# Patient Record
Sex: Male | Born: 1955 | Race: Black or African American | Hispanic: No | Marital: Single
Health system: Southern US, Community
[De-identification: ages and names within clinical notes are randomized; demographics above are authoritative.]

## PROBLEM LIST (undated history)

## (undated) DIAGNOSIS — C3490 Malignant neoplasm of unspecified part of unspecified bronchus or lung: Secondary | ICD-10-CM

## (undated) DIAGNOSIS — I214 Non-ST elevation (NSTEMI) myocardial infarction: Secondary | ICD-10-CM

## (undated) DIAGNOSIS — Z9911 Dependence on respirator [ventilator] status: Secondary | ICD-10-CM

## (undated) DIAGNOSIS — J449 Chronic obstructive pulmonary disease, unspecified: Secondary | ICD-10-CM

## (undated) DIAGNOSIS — I469 Cardiac arrest, cause unspecified: Secondary | ICD-10-CM

---

## 2018-10-27 ENCOUNTER — Inpatient Hospital Stay (HOSPITAL_COMMUNITY)
Admission: EM | Admit: 2018-10-27 | Discharge: 2018-11-02 | DRG: 871 | Disposition: E | Payer: Medicare Other | Source: Skilled Nursing Facility | Attending: Pulmonary Disease | Admitting: Pulmonary Disease

## 2018-10-27 ENCOUNTER — Encounter (HOSPITAL_COMMUNITY): Payer: Self-pay | Admitting: Emergency Medicine

## 2018-10-27 ENCOUNTER — Emergency Department (HOSPITAL_COMMUNITY): Payer: Medicare Other

## 2018-10-27 ENCOUNTER — Other Ambulatory Visit: Payer: Self-pay

## 2018-10-27 DIAGNOSIS — C342 Malignant neoplasm of middle lobe, bronchus or lung: Secondary | ICD-10-CM | POA: Diagnosis present

## 2018-10-27 DIAGNOSIS — D649 Anemia, unspecified: Secondary | ICD-10-CM | POA: Insufficient documentation

## 2018-10-27 DIAGNOSIS — Z7951 Long term (current) use of inhaled steroids: Secondary | ICD-10-CM

## 2018-10-27 DIAGNOSIS — I252 Old myocardial infarction: Secondary | ICD-10-CM | POA: Diagnosis not present

## 2018-10-27 DIAGNOSIS — K922 Gastrointestinal hemorrhage, unspecified: Secondary | ICD-10-CM | POA: Diagnosis present

## 2018-10-27 DIAGNOSIS — C3412 Malignant neoplasm of upper lobe, left bronchus or lung: Secondary | ICD-10-CM | POA: Diagnosis present

## 2018-10-27 DIAGNOSIS — Z20828 Contact with and (suspected) exposure to other viral communicable diseases: Secondary | ICD-10-CM | POA: Diagnosis present

## 2018-10-27 DIAGNOSIS — M21372 Foot drop, left foot: Secondary | ICD-10-CM | POA: Diagnosis present

## 2018-10-27 DIAGNOSIS — I959 Hypotension, unspecified: Secondary | ICD-10-CM

## 2018-10-27 DIAGNOSIS — J869 Pyothorax without fistula: Secondary | ICD-10-CM | POA: Diagnosis present

## 2018-10-27 DIAGNOSIS — D500 Iron deficiency anemia secondary to blood loss (chronic): Secondary | ICD-10-CM | POA: Diagnosis present

## 2018-10-27 DIAGNOSIS — Z515 Encounter for palliative care: Secondary | ICD-10-CM | POA: Diagnosis not present

## 2018-10-27 DIAGNOSIS — J438 Other emphysema: Secondary | ICD-10-CM | POA: Diagnosis present

## 2018-10-27 DIAGNOSIS — I2510 Atherosclerotic heart disease of native coronary artery without angina pectoris: Secondary | ICD-10-CM | POA: Diagnosis present

## 2018-10-27 DIAGNOSIS — I509 Heart failure, unspecified: Secondary | ICD-10-CM | POA: Diagnosis present

## 2018-10-27 DIAGNOSIS — Y95 Nosocomial condition: Secondary | ICD-10-CM | POA: Diagnosis present

## 2018-10-27 DIAGNOSIS — Z930 Tracheostomy status: Secondary | ICD-10-CM

## 2018-10-27 DIAGNOSIS — Z923 Personal history of irradiation: Secondary | ICD-10-CM

## 2018-10-27 DIAGNOSIS — Z8674 Personal history of sudden cardiac arrest: Secondary | ICD-10-CM

## 2018-10-27 DIAGNOSIS — Z9911 Dependence on respirator [ventilator] status: Secondary | ICD-10-CM | POA: Diagnosis not present

## 2018-10-27 DIAGNOSIS — J9611 Chronic respiratory failure with hypoxia: Secondary | ICD-10-CM | POA: Diagnosis not present

## 2018-10-27 DIAGNOSIS — J181 Lobar pneumonia, unspecified organism: Secondary | ICD-10-CM | POA: Diagnosis present

## 2018-10-27 DIAGNOSIS — N179 Acute kidney failure, unspecified: Secondary | ICD-10-CM | POA: Diagnosis present

## 2018-10-27 DIAGNOSIS — Z66 Do not resuscitate: Secondary | ICD-10-CM | POA: Diagnosis not present

## 2018-10-27 DIAGNOSIS — R6521 Severe sepsis with septic shock: Secondary | ICD-10-CM | POA: Diagnosis present

## 2018-10-27 DIAGNOSIS — M21371 Foot drop, right foot: Secondary | ICD-10-CM | POA: Diagnosis present

## 2018-10-27 DIAGNOSIS — Z794 Long term (current) use of insulin: Secondary | ICD-10-CM

## 2018-10-27 DIAGNOSIS — J9621 Acute and chronic respiratory failure with hypoxia: Secondary | ICD-10-CM | POA: Diagnosis present

## 2018-10-27 DIAGNOSIS — J910 Malignant pleural effusion: Secondary | ICD-10-CM | POA: Diagnosis present

## 2018-10-27 DIAGNOSIS — Z7189 Other specified counseling: Secondary | ICD-10-CM | POA: Diagnosis not present

## 2018-10-27 DIAGNOSIS — Z9221 Personal history of antineoplastic chemotherapy: Secondary | ICD-10-CM

## 2018-10-27 DIAGNOSIS — Z79891 Long term (current) use of opiate analgesic: Secondary | ICD-10-CM

## 2018-10-27 DIAGNOSIS — Z931 Gastrostomy status: Secondary | ICD-10-CM

## 2018-10-27 DIAGNOSIS — Z91040 Latex allergy status: Secondary | ICD-10-CM

## 2018-10-27 DIAGNOSIS — Z7982 Long term (current) use of aspirin: Secondary | ICD-10-CM

## 2018-10-27 DIAGNOSIS — A419 Sepsis, unspecified organism: Secondary | ICD-10-CM | POA: Diagnosis present

## 2018-10-27 DIAGNOSIS — I9589 Other hypotension: Secondary | ICD-10-CM | POA: Diagnosis not present

## 2018-10-27 DIAGNOSIS — J432 Centrilobular emphysema: Secondary | ICD-10-CM | POA: Diagnosis present

## 2018-10-27 DIAGNOSIS — Z7902 Long term (current) use of antithrombotics/antiplatelets: Secondary | ICD-10-CM

## 2018-10-27 DIAGNOSIS — Z79899 Other long term (current) drug therapy: Secondary | ICD-10-CM

## 2018-10-27 HISTORY — DX: Cardiac arrest, cause unspecified: I46.9

## 2018-10-27 HISTORY — DX: Malignant neoplasm of unspecified part of unspecified bronchus or lung: C34.90

## 2018-10-27 HISTORY — DX: Chronic obstructive pulmonary disease, unspecified: J44.9

## 2018-10-27 HISTORY — DX: Dependence on respirator (ventilator) status: Z99.11

## 2018-10-27 HISTORY — DX: Non-ST elevation (NSTEMI) myocardial infarction: I21.4

## 2018-10-27 LAB — CBC WITH DIFFERENTIAL/PLATELET
Abs Immature Granulocytes: 0.15 10*3/uL — ABNORMAL HIGH (ref 0.00–0.07)
Basophils Absolute: 0.1 10*3/uL (ref 0.0–0.1)
Basophils Relative: 0 %
Eosinophils Absolute: 0.2 10*3/uL (ref 0.0–0.5)
Eosinophils Relative: 1 %
HCT: 21.6 % — ABNORMAL LOW (ref 39.0–52.0)
Hemoglobin: 6.5 g/dL — CL (ref 13.0–17.0)
Immature Granulocytes: 1 %
Lymphocytes Relative: 12 %
Lymphs Abs: 2.1 10*3/uL (ref 0.7–4.0)
MCH: 31.6 pg (ref 26.0–34.0)
MCHC: 30.1 g/dL (ref 30.0–36.0)
MCV: 104.9 fL — ABNORMAL HIGH (ref 80.0–100.0)
Monocytes Absolute: 1.4 10*3/uL — ABNORMAL HIGH (ref 0.1–1.0)
Monocytes Relative: 8 %
Neutro Abs: 14.1 10*3/uL — ABNORMAL HIGH (ref 1.7–7.7)
Neutrophils Relative %: 78 %
Platelets: 235 10*3/uL (ref 150–400)
RBC: 2.06 MIL/uL — ABNORMAL LOW (ref 4.22–5.81)
RDW: 17.1 % — ABNORMAL HIGH (ref 11.5–15.5)
WBC: 18 10*3/uL — ABNORMAL HIGH (ref 4.0–10.5)
nRBC: 0.1 % (ref 0.0–0.2)

## 2018-10-27 LAB — MRSA PCR SCREENING: MRSA by PCR: POSITIVE — AB

## 2018-10-27 LAB — HEMOGLOBIN AND HEMATOCRIT, BLOOD
HCT: 28 % — ABNORMAL LOW (ref 39.0–52.0)
Hemoglobin: 8.6 g/dL — ABNORMAL LOW (ref 13.0–17.0)

## 2018-10-27 LAB — POCT I-STAT 7, (LYTES, BLD GAS, ICA,H+H)
Acid-Base Excess: 14 mmol/L — ABNORMAL HIGH (ref 0.0–2.0)
Bicarbonate: 41.4 mmol/L — ABNORMAL HIGH (ref 20.0–28.0)
Calcium, Ion: 1.17 mmol/L (ref 1.15–1.40)
HCT: 19 % — ABNORMAL LOW (ref 39.0–52.0)
Hemoglobin: 6.5 g/dL — CL (ref 13.0–17.0)
O2 Saturation: 100 %
Patient temperature: 98.9
Potassium: 5 mmol/L (ref 3.5–5.1)
Sodium: 138 mmol/L (ref 135–145)
TCO2: 44 mmol/L — ABNORMAL HIGH (ref 22–32)
pCO2 arterial: 76.6 mmHg (ref 32.0–48.0)
pH, Arterial: 7.341 — ABNORMAL LOW (ref 7.350–7.450)
pO2, Arterial: 459 mmHg — ABNORMAL HIGH (ref 83.0–108.0)

## 2018-10-27 LAB — COMPREHENSIVE METABOLIC PANEL
ALT: 116 U/L — ABNORMAL HIGH (ref 0–44)
AST: 108 U/L — ABNORMAL HIGH (ref 15–41)
Albumin: 1.4 g/dL — ABNORMAL LOW (ref 3.5–5.0)
Alkaline Phosphatase: 186 U/L — ABNORMAL HIGH (ref 38–126)
Anion gap: 7 (ref 5–15)
BUN: 42 mg/dL — ABNORMAL HIGH (ref 8–23)
CO2: 35 mmol/L — ABNORMAL HIGH (ref 22–32)
Calcium: 8 mg/dL — ABNORMAL LOW (ref 8.9–10.3)
Chloride: 96 mmol/L — ABNORMAL LOW (ref 98–111)
Creatinine, Ser: 0.94 mg/dL (ref 0.61–1.24)
GFR calc Af Amer: >60 mL/min (ref >60)
GFR calc non Af Amer: >60 mL/min (ref >60)
Glucose, Bld: 158 mg/dL — ABNORMAL HIGH (ref 70–99)
Potassium: 5 mmol/L (ref 3.5–5.1)
Sodium: 138 mmol/L (ref 135–145)
Total Bilirubin: 0.7 mg/dL (ref 0.3–1.2)
Total Protein: 7.3 g/dL (ref 6.5–8.1)

## 2018-10-27 LAB — CREATININE, SERUM
Creatinine, Ser: 0.79 mg/dL (ref 0.61–1.24)
GFR calc Af Amer: >60 mL/min (ref >60)
GFR calc non Af Amer: >60 mL/min (ref >60)

## 2018-10-27 LAB — URINALYSIS, ROUTINE W REFLEX MICROSCOPIC
Bilirubin Urine: NEGATIVE
Glucose, UA: NEGATIVE mg/dL
Ketones, ur: NEGATIVE mg/dL
Leukocytes,Ua: NEGATIVE
Nitrite: NEGATIVE
Protein, ur: 100 mg/dL — AB
RBC / HPF: >50 RBC/hpf — ABNORMAL HIGH (ref 0–5)
Specific Gravity, Urine: 1.018 (ref 1.005–1.030)
pH: 5 (ref 5.0–8.0)

## 2018-10-27 LAB — GLUCOSE, CAPILLARY
Glucose-Capillary: 78 mg/dL (ref 70–99)
Glucose-Capillary: 95 mg/dL (ref 70–99)

## 2018-10-27 LAB — SARS CORONAVIRUS 2 (TAT 6-24 HRS): SARS Coronavirus 2: NEGATIVE

## 2018-10-27 LAB — ABO/RH: ABO/RH(D): O POS

## 2018-10-27 LAB — LACTIC ACID, PLASMA: Lactic Acid, Venous: 0.6 mmol/L (ref 0.5–1.9)

## 2018-10-27 LAB — POC OCCULT BLOOD, ED: Fecal Occult Bld: POSITIVE — AB

## 2018-10-27 LAB — PREPARE RBC (CROSSMATCH)

## 2018-10-27 MED ORDER — IOHEXOL 300 MG/ML  SOLN
100.0000 mL | Freq: Once | INTRAMUSCULAR | Status: AC | PRN
  Administered 2018-10-27: 100 mL via INTRAVENOUS

## 2018-10-27 MED ORDER — SODIUM CHLORIDE 0.9 % IV SOLN
500.0000 mg | INTRAVENOUS | Status: DC
  Administered 2018-10-27: 23:00:00 500 mg via INTRAVENOUS
  Filled 2018-10-27 (×2): qty 500

## 2018-10-27 MED ORDER — SODIUM CHLORIDE 0.9 % IV BOLUS
1000.0000 mL | Freq: Once | INTRAVENOUS | Status: AC
  Administered 2018-10-27: 16:00:00 1000 mL via INTRAVENOUS

## 2018-10-27 MED ORDER — ORAL CARE MOUTH RINSE
15.0000 mL | OROMUCOSAL | Status: DC
  Administered 2018-10-27 – 2018-10-28 (×8): 15 mL via OROMUCOSAL

## 2018-10-27 MED ORDER — VANCOMYCIN HCL 500 MG IV SOLR
500.0000 mg | Freq: Once | INTRAVENOUS | Status: AC
  Administered 2018-10-27: 500 mg via INTRAVENOUS
  Filled 2018-10-27 (×2): qty 500

## 2018-10-27 MED ORDER — CHLORHEXIDINE GLUCONATE 0.12% ORAL RINSE (MEDLINE KIT)
15.0000 mL | Freq: Two times a day (BID) | OROMUCOSAL | Status: DC
  Administered 2018-10-27 – 2018-10-28 (×2): 15 mL via OROMUCOSAL

## 2018-10-27 MED ORDER — VANCOMYCIN HCL IN DEXTROSE 1-5 GM/200ML-% IV SOLN
1000.0000 mg | Freq: Once | INTRAVENOUS | Status: AC
  Administered 2018-10-27: 1000 mg via INTRAVENOUS
  Filled 2018-10-27: qty 200

## 2018-10-27 MED ORDER — PIPERACILLIN-TAZOBACTAM 3.375 G IVPB
3.3750 g | Freq: Three times a day (TID) | INTRAVENOUS | Status: DC
  Administered 2018-10-28: 05:00:00 3.375 g via INTRAVENOUS
  Filled 2018-10-27 (×4): qty 50

## 2018-10-27 MED ORDER — ENOXAPARIN SODIUM 40 MG/0.4ML ~~LOC~~ SOLN
40.0000 mg | SUBCUTANEOUS | Status: DC
  Administered 2018-10-27: 40 mg via SUBCUTANEOUS
  Filled 2018-10-27: qty 0.4

## 2018-10-27 MED ORDER — SODIUM CHLORIDE 0.9 % IV SOLN
10.0000 mL/h | Freq: Once | INTRAVENOUS | Status: AC
  Administered 2018-10-27: 10 mL/h via INTRAVENOUS

## 2018-10-27 MED ORDER — DEXTROSE-NACL 5-0.45 % IV SOLN
INTRAVENOUS | Status: DC
  Administered 2018-10-27: 23:00:00 via INTRAVENOUS

## 2018-10-27 MED ORDER — PIPERACILLIN-TAZOBACTAM 3.375 G IVPB 30 MIN: 3.3750 g | Freq: Three times a day (TID) | INTRAVENOUS | Status: DC

## 2018-10-27 MED ORDER — SODIUM CHLORIDE 0.9 % IV BOLUS
1000.0000 mL | Freq: Once | INTRAVENOUS | Status: AC
  Administered 2018-10-27: 13:00:00 1000 mL via INTRAVENOUS

## 2018-10-27 MED ORDER — VANCOMYCIN HCL 10 G IV SOLR
1250.0000 mg | INTRAVENOUS | Status: DC
  Filled 2018-10-27: qty 1250

## 2018-10-27 MED ORDER — ALBUTEROL SULFATE (2.5 MG/3ML) 0.083% IN NEBU
3.0000 mL | INHALATION_SOLUTION | Freq: Four times a day (QID) | RESPIRATORY_TRACT | Status: DC
  Administered 2018-10-27 – 2018-10-28 (×4): 3 mL via RESPIRATORY_TRACT
  Filled 2018-10-27 (×2): qty 3
  Filled 2018-10-27: qty 6

## 2018-10-27 MED ORDER — PIPERACILLIN-TAZOBACTAM 3.375 G IVPB 30 MIN
3.3750 g | Freq: Once | INTRAVENOUS | Status: DC
  Filled 2018-10-27: qty 50

## 2018-10-27 MED ORDER — CHLORHEXIDINE GLUCONATE CLOTH 2 % EX PADS
6.0000 | MEDICATED_PAD | Freq: Every day | CUTANEOUS | Status: DC
  Administered 2018-10-27: 6 via TOPICAL

## 2018-10-27 MED ORDER — PIPERACILLIN-TAZOBACTAM 3.375 G IVPB 30 MIN
3.3750 g | Freq: Once | INTRAVENOUS | Status: AC
  Administered 2018-10-27: 3.375 g via INTRAVENOUS
  Filled 2018-10-27: qty 50

## 2018-10-27 MED ORDER — ALBUMIN HUMAN 25 % IV SOLN
50.0000 g | Freq: Once | INTRAVENOUS | Status: DC
  Filled 2018-10-27 (×2): qty 50

## 2018-10-27 NOTE — ED Notes (Signed)
ED TO INPATIENT HANDOFF REPORT  ED Nurse Abbott and Phone #: Lunette Stands Dave Abbott/Age/Gender Dave Abbott 63 y.o. male Room/Bed: RESUSC/RESUSC  Code Status   Code Status: Not on file  Home/SNF/Other Skilled nursing facility Patient oriented to: self, place, time and situation Is this baseline? Yes   Triage Complete: Triage complete  Chief Complaint bagging  Triage Note Per EMS- pt arrives from [facility], full code, and vent dependent. Pt is here for eval of hypotension of unknown cause. BP 80s, given 1 L of fluid.    Allergies Allergies  Allergen Reactions  . Latex     unknown  . Dave Abbott Isothiocyanate]     unknown    Level of Care/Admitting Diagnosis ED Disposition    ED Disposition Condition [facility] Area: [place] [100100]  Level of Care: ICU [6]  Covid Evaluation: Person Under Investigation (PUI)  Diagnosis: GIB (gastrointestinal bleeding) [474259]  Admitting Physician: Audria Nine [[id number]]  Attending Physician: Audria Nine [phone]  Estimated length of stay: 3 - 4 days  Certification:: I certify this patient will need inpatient services for at least 2 midnights  PT Class (Do Not Modify): Inpatient [101]  PT Acc Code (Do Not Modify): Private [1]       B Medical/Surgery History Past Medical History:  Diagnosis Date  . Cardiac arrest ([place])   . COPD (chronic obstructive pulmonary disease) ([place])   . Lung cancer ([place] )   . MI, acute, non ST segment elevation ([place])   . PEA (Pulseless electrical activity) ([place])   . Ventilator dependence ([place])        A IV Location/Drains/Wounds Patient Lines/Drains/Airways Status   Active Line/Drains/Airways    Abbott:   Placement date:   Placement time:   Site:   Days:   Implanted Port 10/13/2018 Right Chest   10/05/2018    1139    Chest   less than 1   Peripheral IV 10/26/2018 Left;Upper Arm   10/25/2018    1340    Arm   less than 1   Tracheostomy [place] 8 mm  Distal;Cuffed   -    -    8 mm             Intake/Output Last 24 hours  Intake/Output Summary (Last 24 hours) at 10/26/2018 1730 Last data filed at 10/22/2018 1716 Gross per 24 hour  Intake 1630 ml  Output -  Net 1630 ml    Labs/Imaging Results for orders placed or performed during the hospital encounter of 10/23/2018 (from the past 48 hour(s))  CBC with Differential/Platelet     Status: Abnormal   Collection Time: 10/19/2018 11:41 AM  Result Value Ref Range   WBC 18.0 (H) 4.0 - 10.5 K/uL   RBC 2.06 (L) 4.22 - 5.81 MIL/uL   Hemoglobin 6.5 (LL) 13.0 - 17.0 g/dL    Comment: REPEATED TO VERIFY THIS CRITICAL RESULT HAS VERIFIED AND BEEN CALLED TO A.LLOYD,RN BY BONNIE DAVIS ON 08 26 2020 AT 1213, AND HAS BEEN READ BACK.     HCT 21.6 (L) 39.0 - 52.0 %   MCV 104.9 (H) 80.0 - 100.0 fL   MCH 31.6 26.0 - 34.0 pg   MCHC 30.1 30.0 - 36.0 g/dL   RDW 17.1 (H) 11.5 - 15.5 %   Platelets 235 150 - 400 K/uL   nRBC 0.1 0.0 - 0.2 %   Neutrophils Relative % 78 %   Neutro Abs 14.1 (H) 1.7 -  7.7 K/uL   Lymphocytes Relative 12 %   Lymphs Abs 2.1 0.7 - 4.0 K/uL   Monocytes Relative 8 %   Monocytes Absolute 1.4 (H) 0.1 - 1.0 K/uL   Eosinophils Relative 1 %   Eosinophils Absolute 0.2 0.0 - 0.5 K/uL   Basophils Relative 0 %   Basophils Absolute 0.1 0.0 - 0.1 K/uL   Immature Granulocytes 1 %   Abs Immature Granulocytes 0.15 (H) 0.00 - 0.07 K/uL    Comment: Performed at [facility] [address] [place], [place] [address]  Comprehensive metabolic panel     Status: Abnormal   Collection Time: 10/12/2018 11:41 AM  Result Value Ref Range   Sodium 138 135 - 145 mmol/L   Potassium 5.0 3.5 - 5.1 mmol/L   Chloride 96 (L) 98 - 111 mmol/L   CO2 35 (H) 22 - 32 mmol/L   Glucose, Bld 158 (H) 70 - 99 mg/dL   BUN 42 (H) 8 - 23 mg/dL   Creatinine, Ser 0.94 0.61 - 1.24 mg/dL   Calcium 8.0 (L) 8.9 - 10.3 mg/dL   Total Protein 7.3 6.5 - 8.1 g/dL   Albumin 1.4 (L) 3.5 - 5.0 g/dL   AST 108 (H) 15 - 41  U/L   ALT 116 (H) 0 - 44 U/L   Alkaline Phosphatase 186 (H) 38 - 126 U/L   Total Bilirubin 0.7 0.3 - 1.2 mg/dL   GFR calc non Af Amer >60 >60 mL/min   GFR calc Af Amer >60 >60 mL/min   Anion gap 7 5 - 15    Comment: Performed at [facility], [place] [address], [place], [place] [address]  Urinalysis, Routine w reflex microscopic     Status: Abnormal   Collection Time: 10/26/2018 11:41 AM  Result Value Ref Range   Color, Urine AMBER (A) YELLOW    Comment: BIOCHEMICALS MAY BE AFFECTED BY COLOR   APPearance CLOUDY (A) CLEAR   Specific Gravity, Urine 1.018 1.005 - 1.030   pH 5.0 5.0 - 8.0   Glucose, UA NEGATIVE NEGATIVE mg/dL   Hgb urine dipstick LARGE (A) NEGATIVE   Bilirubin Urine NEGATIVE NEGATIVE   Ketones, ur NEGATIVE NEGATIVE mg/dL   Protein, ur 100 (A) NEGATIVE mg/dL   Nitrite NEGATIVE NEGATIVE   Leukocytes,Ua NEGATIVE NEGATIVE   RBC / HPF >50 (H) 0 - 5 RBC/hpf   WBC, UA 21-50 0 - 5 WBC/hpf   Bacteria, UA MANY (A) NONE SEEN   Squamous Epithelial / LPF 0-5 0 - 5   Hyaline Casts, UA PRESENT    Granular Casts, UA PRESENT     Comment: Performed at [facility], [address], [place], [place] [address]  Lactic acid, plasma     Status: None   Collection Time: 10/09/2018 11:53 AM  Result Value Ref Range   Lactic Acid, Venous 0.6 0.5 - 1.9 mmol/L    Comment: Performed at [facility] [address] [place], [place] [address]  Prepare RBC     Status: None   Collection Time: 10/20/2018 12:19 PM  Result Value Ref Range   Order Confirmation      ORDER PROCESSED BY BLOOD BANK Performed at [facility], [place] [address] [place], [place] [address]   Type and screen Ordered by PROVIDER DEFAULT     Status: None (Preliminary result)   Collection Time: 10/07/2018 12:19 PM  Result Value Ref Range   ABO/RH(D) O POS  Antibody Screen NEG    Sample Expiration 10/30/2018,2359    Unit Number [id number]    Blood Component Type RED CELLS,LR    Unit division 00    Status  of Unit ALLOCATED    Transfusion Status OK TO TRANSFUSE    Crossmatch Result Compatible    Unit Number [id number]    Blood Component Type RED CELLS,LR    Unit division 00    Status of Unit ISSUED    Transfusion Status OK TO TRANSFUSE    Crossmatch Result      Compatible Performed at [facility], [address] [place], [place] [address]   ABO/Rh     Status: None   Collection Time: 10/06/2018 12:19 PM  Result Value Ref Range   ABO/RH(D)      O POS Performed at [facility] [address] [place], [place] [address]   I-STAT 7, (LYTES, BLD GAS, ICA, H+H)     Status: Abnormal   Collection Time: 10/31/2018 12:41 PM  Result Value Ref Range   pH, Arterial 7.341 (L) 7.350 - 7.450   pCO2 arterial 76.6 (HH) 32.0 - 48.0 mmHg   pO2, Arterial 459.0 (H) 83.0 - 108.0 mmHg   Bicarbonate 41.4 (H) 20.0 - 28.0 mmol/L   TCO2 44 (H) 22 - 32 mmol/L   O2 Saturation 100.0 %   Acid-Base Excess 14.0 (H) 0.0 - 2.0 mmol/L   Sodium 138 135 - 145 mmol/L   Potassium 5.0 3.5 - 5.1 mmol/L   Calcium, Ion 1.17 1.15 - 1.40 mmol/L   HCT 19.0 (L) 39.0 - 52.0 %   Hemoglobin 6.5 (LL) 13.0 - 17.0 g/dL   Patient temperature 98.9 F    Collection site RADIAL, ALLEN'S TEST ACCEPTABLE    Drawn by RT    Sample type ARTERIAL    Comment NOTIFIED PHYSICIAN   POC occult blood, ED     Status: Abnormal   Collection Time: 10/09/2018 12:57 PM  Result Value Ref Range   Fecal Occult Bld POSITIVE (A) NEGATIVE   Ct Chest W Contrast  Result Date: 10/09/2018 CLINICAL DATA:  63 year old male with history of unresolved pneumonia. EXAM: CT CHEST, ABDOMEN, AND PELVIS WITH CONTRAST TECHNIQUE: Multidetector CT imaging of the chest, abdomen and pelvis was performed following the standard protocol during bolus administration of intravenous contrast. CONTRAST:  172mL OMNIPAQUE IOHEXOL 300 MG/ML  SOLN COMPARISON:  No priors. FINDINGS: CT CHEST FINDINGS Cardiovascular: Heart size is normal. There is no significant pericardial  fluid, thickening or pericardial calcification. There is aortic atherosclerosis, as well as atherosclerosis of the great vessels of the mediastinum and the coronary arteries, including calcified atherosclerotic plaque in the left anterior descending coronary artery. Right internal jugular single-lumen porta cath with tip terminating in the mid superior vena cava. Mediastinum/Nodes: Tracheostomy tube in position with tip in the mid trachea. Borderline enlarged subcarinal lymph node measuring 1.1 cm in short axis. No other pathologically enlarged mediastinal or hilar lymph nodes. Esophagus is unremarkable in appearance. No axillary lymphadenopathy. Lungs/Pleura: Patchy areas of airspace consolidation are noted in the lungs bilaterally, most confluent in the right middle lobe where there is also extensive volume loss. Several other nodular appearing areas are noted, largest of which includes a 1.7 x 0.9 cm area in the right lower lobe (axial image 97 of series 5). Large complex loculated left pleural fluid collection in the mid to upper left hemithorax with peripheral rim enhancement, most compatible with a large empyema measuring 13.8 x  10.0 x 4.7 cm (axial image 33 of series 4 and coronal image 63 of series 6). Other smaller complex loculated rim enhancing fluid collections are noted bilaterally, largest of which is in the subpulmonic space in the lower right hemithorax (axial image 58 of series 4 and coronal image 49 of series 6) measuring 11.3 x 6.6 x 3.9 cm. Near complete atelectasis of much of the left lung with some aerated portions in the basal segments of the left lower lobe where there is extensive atelectasis and patchy airspace consolidation. Diffuse bronchial wall thickening with severe centrilobular and paraseptal emphysema. Musculoskeletal: There are no aggressive appearing lytic or blastic lesions noted in the visualized portions of the skeleton. CT ABDOMEN PELVIS FINDINGS Hepatobiliary: No suspicious  cystic or solid hepatic lesions. No intra or extrahepatic biliary ductal dilatation. Gallbladder is nearly decompressed, but otherwise unremarkable in appearance. Pancreas: No pancreatic mass. No pancreatic ductal dilatation. No pancreatic or peripancreatic fluid collections or inflammatory changes. Spleen: Unremarkable. Adrenals/Urinary Tract: Bilateral adrenal glands are normal in appearance. Low-attenuation lesions in both kidneys, compatible with simple cysts, largest of which measure up to 3.6 x 2.7 cm in the upper pole of left kidney. In addition, in the interpolar region of the left kidney there is a 8.7 x 7.5 cm low-attenuation lesion with a thin internal septation, compatible with a large Bosniak class 2 cyst. No suspicious renal lesions. Subcentimeter low-attenuation lesions in the right kidney, too small to definitively characterize, but statistically likely to represent tiny cysts. No hydroureteronephrosis. Foley balloon catheter in the urinary bladder. Gas in the nondependent portion of the urinary bladder, presumably iatrogenic. Urinary bladder is otherwise unremarkable in appearance. Stomach/Bowel: Percutaneous gastrostomy tube in the distal body of the stomach. No pathologic dilatation of small bowel or colon. Normal appendix. Vascular/Lymphatic: Aortic atherosclerosis, without evidence of aneurysm or dissection in the abdominal or pelvic vasculature. No lymphadenopathy noted in the abdomen or pelvis. Reproductive: Prostate gland and seminal vesicles are unremarkable in appearance. Other: No significant volume of ascites.  No pneumoperitoneum. Musculoskeletal: There are no aggressive appearing lytic or blastic lesions noted in the visualized portions of the skeleton. IMPRESSION: 1. Multilobar bilateral pneumonia with bilateral empyemas, as detailed above. 2. No acute findings noted in the abdomen or pelvis. 3. Diffuse bronchial wall thickening with severe centrilobular and paraseptal emphysema;  imaging findings suggestive of underlying COPD. 4. Aortic atherosclerosis, in addition to left anterior descending coronary artery disease. Please note that although the presence of coronary artery calcium documents the presence of coronary artery disease, the severity of this disease and any potential stenosis cannot be assessed on this non-gated CT examination. Assessment for potential risk factor modification, dietary therapy or pharmacologic therapy may be warranted, if clinically indicated. 5. Additional incidental findings, as above. Electronically Signed   By: Vinnie Langton M.D.   On: 10/09/2018 14:55   Ct Abdomen Pelvis W Contrast  Result Date: 10/02/2018 CLINICAL DATA:  63 year old male with history of unresolved pneumonia. EXAM: CT CHEST, ABDOMEN, AND PELVIS WITH CONTRAST TECHNIQUE: Multidetector CT imaging of the chest, abdomen and pelvis was performed following the standard protocol during bolus administration of intravenous contrast. CONTRAST:  176mL OMNIPAQUE IOHEXOL 300 MG/ML  SOLN COMPARISON:  No priors. FINDINGS: CT CHEST FINDINGS Cardiovascular: Heart size is normal. There is no significant pericardial fluid, thickening or pericardial calcification. There is aortic atherosclerosis, as well as atherosclerosis of the great vessels of the mediastinum and the coronary arteries, including calcified atherosclerotic plaque in the left anterior descending coronary  artery. Right internal jugular single-lumen porta cath with tip terminating in the mid superior vena cava. Mediastinum/Nodes: Tracheostomy tube in position with tip in the mid trachea. Borderline enlarged subcarinal lymph node measuring 1.1 cm in short axis. No other pathologically enlarged mediastinal or hilar lymph nodes. Esophagus is unremarkable in appearance. No axillary lymphadenopathy. Lungs/Pleura: Patchy areas of airspace consolidation are noted in the lungs bilaterally, most confluent in the right middle lobe where there is also  extensive volume loss. Several other nodular appearing areas are noted, largest of which includes a 1.7 x 0.9 cm area in the right lower lobe (axial image 97 of series 5). Large complex loculated left pleural fluid collection in the mid to upper left hemithorax with peripheral rim enhancement, most compatible with a large empyema measuring 13.8 x 10.0 x 4.7 cm (axial image 33 of series 4 and coronal image 63 of series 6). Other smaller complex loculated rim enhancing fluid collections are noted bilaterally, largest of which is in the subpulmonic space in the lower right hemithorax (axial image 58 of series 4 and coronal image 49 of series 6) measuring 11.3 x 6.6 x 3.9 cm. Near complete atelectasis of much of the left lung with some aerated portions in the basal segments of the left lower lobe where there is extensive atelectasis and patchy airspace consolidation. Diffuse bronchial wall thickening with severe centrilobular and paraseptal emphysema. Musculoskeletal: There are no aggressive appearing lytic or blastic lesions noted in the visualized portions of the skeleton. CT ABDOMEN PELVIS FINDINGS Hepatobiliary: No suspicious cystic or solid hepatic lesions. No intra or extrahepatic biliary ductal dilatation. Gallbladder is nearly decompressed, but otherwise unremarkable in appearance. Pancreas: No pancreatic mass. No pancreatic ductal dilatation. No pancreatic or peripancreatic fluid collections or inflammatory changes. Spleen: Unremarkable. Adrenals/Urinary Tract: Bilateral adrenal glands are normal in appearance. Low-attenuation lesions in both kidneys, compatible with simple cysts, largest of which measure up to 3.6 x 2.7 cm in the upper pole of left kidney. In addition, in the interpolar region of the left kidney there is a 8.7 x 7.5 cm low-attenuation lesion with a thin internal septation, compatible with a large Bosniak class 2 cyst. No suspicious renal lesions. Subcentimeter low-attenuation lesions in the  right kidney, too small to definitively characterize, but statistically likely to represent tiny cysts. No hydroureteronephrosis. Foley balloon catheter in the urinary bladder. Gas in the nondependent portion of the urinary bladder, presumably iatrogenic. Urinary bladder is otherwise unremarkable in appearance. Stomach/Bowel: Percutaneous gastrostomy tube in the distal body of the stomach. No pathologic dilatation of small bowel or colon. Normal appendix. Vascular/Lymphatic: Aortic atherosclerosis, without evidence of aneurysm or dissection in the abdominal or pelvic vasculature. No lymphadenopathy noted in the abdomen or pelvis. Reproductive: Prostate gland and seminal vesicles are unremarkable in appearance. Other: No significant volume of ascites.  No pneumoperitoneum. Musculoskeletal: There are no aggressive appearing lytic or blastic lesions noted in the visualized portions of the skeleton. IMPRESSION: 1. Multilobar bilateral pneumonia with bilateral empyemas, as detailed above. 2. No acute findings noted in the abdomen or pelvis. 3. Diffuse bronchial wall thickening with severe centrilobular and paraseptal emphysema; imaging findings suggestive of underlying COPD. 4. Aortic atherosclerosis, in addition to left anterior descending coronary artery disease. Please note that although the presence of coronary artery calcium documents the presence of coronary artery disease, the severity of this disease and any potential stenosis cannot be assessed on this non-gated CT examination. Assessment for potential risk factor modification, dietary therapy or pharmacologic therapy may be  warranted, if clinically indicated. 5. Additional incidental findings, as above. Electronically Signed   By: Vinnie Langton M.D.   On: 10/02/2018 14:55   Dg Chest Port 1 View  Result Date: 10/12/2018 CLINICAL DATA:  Hypotension EXAM: PORTABLE CHEST 1 VIEW COMPARISON:  None. FINDINGS: Tracheostomy is present with tip 6.9 cm above the  carina. Port-A-Cath tip is in the superior vena cava. No pneumothorax. There is extensive postoperative change on the left with consolidation throughout most of the left upper and mid lung regions. There is aeration in the left base region with fibrotic appearing changes in the left base. On the right, there is fibrosis in the mid and lower lung zones. There is postoperative change in the right upper lobe. No consolidation on the right. Heart size is grossly normal. Pulmonary vascular on the right appears normal. Pulmonary vascularity on the left is obscured. No adenopathy is seen to the right of midline. Opacification precludes assessment for potential adenopathy on the left. No bone lesions are evident. IMPRESSION: Extensive postoperative changes. Consolidation throughout much of the left mid and lower lung zones. Question infiltrate versus mass. Both entities may be present concurrently. There is fibrosis in the left base. There is also fibrosis in the right mid and lower lung zones. No consolidation on the right. Cardiac silhouette grossly normal. Tube and catheter positions as described without demonstrable pneumothorax. Correlation with CT, ideally with intravenous contrast, may well be advised to further assess areas which are opacified on this current examination. Electronically Signed   By: Lowella Grip III M.D.   On: 10/12/2018 12:00    Pending Labs Unresulted Labs (From admission, onward)    Start     Ordered   10/22/2018 2000  Hemoglobin and hematocrit, blood  Now then every 6 hours,   R     10/23/2018 1544   10/24/2018 1548  Culture, respiratory (non-expectorated)  Once,   STAT     11/01/2018 1548   10/12/2018 1255  Urine culture  ONCE - STAT,   STAT     10/06/2018 1254   10/03/2018 1235  Occult blood card to lab, stool  Once,   STAT     10/14/2018 1235   10/05/2018 1211  Blood gas, arterial  Once,   R     10/02/2018 1210   10/24/2018 1149  SARS CORONAVIRUS 2 (TAT 6-12 HRS) Nasal Swab Aptima Multi Swab   (Asymptomatic/Tier 2)  Once,   STAT    Question Answer Comment  Is this test for diagnosis or screening Screening   Symptomatic for COVID-19 as defined by CDC No   Hospitalized for COVID-19 No   Admitted to ICU for COVID-19 No   Previously tested for COVID-19 Yes   Resident in a congregate (group) care setting Yes   Employed in healthcare setting No      11/01/2018 1148   10/08/2018 1141  Culture, blood (Routine X 2) w Reflex to ID Panel  BLOOD CULTURE X 2,   STAT     10/24/2018 1141   Signed and Held  HIV antibody (Routine Testing)  Once,   R     Signed and Held   Signed and Held  Magnesium  Once,   R     Signed and Held   Signed and Held  Comprehensive metabolic panel  Once,   R     Signed and Held   Signed and Held  CBC  Once,   R     Signed and  Held   Signed and Held  Protime-INR  Once,   R     Signed and Held   Signed and Occupational hygienist morning,   R     Signed and Held   Signed and Held  CBC  Tomorrow morning,   R     Signed and Held   Signed and Held  Magnesium  Tomorrow morning,   R     Signed and Held   Signed and Held  Phosphorus  Tomorrow morning,   R     Signed and Held          Vitals/Pain Today's Vitals   10/30/2018 1500 10/03/2018 1518 10/14/2018 1630 10/24/2018 1713  BP: (!) 83/63  96/65 101/73  Pulse:    97  Resp: (!) 22  15 (!) 23  Temp:    98.2 F (36.8 C)  TempSrc:    Oral  SpO2:  100%  97%  Height:        Isolation Precautions No active isolations  Medications Medications  0.9 %  sodium chloride infusion (10 mL/hr Intravenous New Bag/Given 10/15/2018 1642)  sodium chloride 0.9 % bolus 1,000 mL (0 mLs Intravenous Stopped 10/08/2018 1530)  vancomycin (VANCOCIN) IVPB 1000 mg/200 mL premix (0 mg Intravenous Stopped 10/24/2018 1516)  piperacillin-tazobactam (ZOSYN) IVPB 3.375 g (0 g Intravenous Stopped 10/25/2018 1420)  iohexol (OMNIPAQUE) 300 MG/ML solution 100 mL (100 mLs Intravenous Contrast Given 10/03/2018 1411)  sodium chloride 0.9 % bolus  1,000 mL (0 mLs Intravenous Stopped 10/06/2018 1640)    Mobility non-ambulatory     Focused Assessments Cardiac Assessment Handoff:    No results found for: CKTOTAL, CKMB, CKMBINDEX, TROPONINI No results found for: DDIMER Does the Patient currently have chest pain? No      R Recommendations: See Admitting Provider Note  Report given to:   Additional Notes: N/A

## 2018-10-27 NOTE — ED Provider Notes (Signed)
[facility] EMERGENCY DEPARTMENT Provider Note   CSN: [id number] Arrival date & time: 10/31/2018  1129     History   Chief Complaint Chief Complaint  Patient presents with  . Hypotension    HPI Dave Abbott is a 63 y.o. male.     HPI Patient arrives from [facility] by EMS.  Patient is on a ventilator.  He is unable to contribute to history.  Noted to be hypotensive by staff.  Given 1 L of normal saline prior to arrival. Past Medical History:  Diagnosis Date  . Cardiac arrest ([place])   . COPD (chronic obstructive pulmonary disease) ([place])   . Lung cancer ([place])   . MI, acute, non ST segment elevation ([place])   . PEA (Pulseless electrical activity) ([place])   . Ventilator dependence [facility])     Patient Active Problem List   Diagnosis Date Noted  . GIB (gastrointestinal bleeding) 10/24/2018  . Ventilator dependent ([place])   . Septic shock ([place])   . Symptomatic anemia         Home Medications    Prior to Admission medications   Medication Sig Start Date End Date Taking? Authorizing Provider  acetaminophen (TYLENOL) 325 MG tablet Place 650 mg into feeding tube every 6 (six) hours as needed for mild pain or fever.   Yes [provider]  ALPRAZolam Duanne Moron) 0.5 MG tablet Place 0.5 mg into feeding tube at bedtime as needed for anxiety.   Yes [provider]  aspirin 81 MG chewable tablet Chew 81 mg by mouth daily.   Yes [provider]  atorvastatin (LIPITOR) 80 MG tablet Place 80 mg into feeding tube daily.   Yes [provider]  bisacodyl (DULCOLAX) 5 MG EC tablet Take 5 mg by mouth daily as needed for mild constipation.   Yes [provider]  budesonide (PULMICORT) 0.5 MG/2ML nebulizer solution Take 0.5 mg by nebulization 2 (two) times daily.   Yes [provider]  carvedilol (COREG) 6.25 MG tablet Place 6.25 mg into feeding tube 2 (two) times daily with a meal.   Yes [provider]   chlorhexidine (PERIDEX) 0.12 % solution Use as directed 3 mLs in the mouth or throat daily.   Yes [provider]  clopidogrel (PLAVIX) 75 MG tablet Take 75 mg by mouth daily.   Yes [provider]  docusate (COLACE) 50 MG/5ML liquid Place 50 mg into feeding tube 2 (two) times daily.   Yes [provider]  famotidine (PEPCID) 20 MG tablet Place 20 mg into feeding tube 2 (two) times daily.   Yes [provider]  fentaNYL (DURAGESIC) 12 MCG/HR Place 1 patch onto the skin every 3 (three) days.   Yes [provider]  ferrous sulfate 300 (60 Fe) MG/5ML syrup Place 300 mg into feeding tube daily.   Yes [provider]  Heparin Sodium, Porcine, PF 5000 UNIT/ML SOLN Inject 5,000 Units as directed every 12 (twelve) hours.   Yes [provider]  insulin lispro (HUMALOG) 100 UNIT/ML injection Inject 3-15 Units into the skin See admin instructions. Sliding Scale Insulin: Blood sugar is <60.00 or > 500.00 Notify MD.  Blood Sugar is 151-200 give 3 units, 201-300 give 5 units, 301-400 give 10 units, 401-500 give 25 units. NOTE: Greater than 500 administer 20 units, repeat BS after 2hrs, treat as indicated.   Yes [provider]  ipratropium-albuterol (DUONEB) 0.5-2.5 (3) MG/3ML SOLN Take 3 mLs by nebulization every 6 (six) hours.  Yes [provider]  Multiple Vitamin (MULTIVITAMIN WITH MINERALS) TABS tablet Place 1 tablet into feeding tube daily.   Yes [provider]  oxyCODONE (OXY IR/ROXICODONE) 5 MG immediate release tablet Place 5 mg into feeding tube daily as needed for severe pain.   Yes [provider]  polyethylene glycol (MIRALAX / GLYCOLAX) 17 g packet Place 17 g into feeding tube daily.   Yes [provider]  senna (SENOKOT) 8.6 MG tablet Take 1 tablet by mouth daily.   Yes [provider]  Sodium Phosphates ([employer] ENEMA READY-TO-USE) 7-19 GM/118ML ENEM Place 1 enema rectally daily as  needed (For Constipation).   Yes [provider]  venlafaxine (EFFEXOR) 25 MG tablet Place 25 mg into feeding tube 2 (two) times daily.   Yes [provider]    Family History No family history on file.  Social History Social History   Tobacco Use  . Smoking status: Not on file  Substance Use Topics  . Alcohol use: Not on file  . Drug use: Not on file     Allergies   Latex and Mustard [allyl isothiocyanate]   Review of Systems Review of Systems  Unable to perform ROS: Patient nonverbal     Physical Exam Updated Vital Signs BP (!) 89/65   Pulse 90   Temp 98.5 F (36.9 C) (Oral)   Resp 16   Ht 5\' 7"  (1.702 m)   Wt 55.4 kg   SpO2 92%   BMI 19.13 kg/m   Physical Exam Vitals signs and nursing note reviewed.  Constitutional:      Appearance: He is well-developed.     Comments: Chronically ill-appearing  HENT:     Head: Normocephalic and atraumatic.  Eyes:     Pupils: Pupils are equal, round, and reactive to light.  Neck:     Musculoskeletal: Normal range of motion and neck supple. No neck rigidity or muscular tenderness.     Vascular: No carotid bruit.     Comments: Trach in place Cardiovascular:     Rate and Rhythm: Regular rhythm. Tachycardia present.     Heart sounds: No murmur. No friction rub. No gallop.   Pulmonary:     Effort: Pulmonary effort is normal.     Breath sounds: Wheezing present.  Abdominal:     General: Bowel sounds are normal. There is distension.     Palpations: Abdomen is soft.     Tenderness: There is no abdominal tenderness. There is no guarding or rebound.     Comments: PEG in left upper quadrant.  Abdomen is diffusely distended.  Musculoskeletal: Normal range of motion.        General: No swelling, tenderness, deformity or signs of injury.     Right lower leg: No edema.     Left lower leg: No edema.  Lymphadenopathy:     Cervical: No cervical adenopathy.  Skin:    General: Skin is warm and dry.      Capillary Refill: Capillary refill takes less than 2 seconds.     Findings: No erythema or rash.  Neurological:     Mental Status: He is alert.     Comments: Patient is alert.  Appears to follow some very simple commands.      ED Treatments / Results  Labs (all labs ordered are listed, but only abnormal results are displayed) Labs Reviewed  MRSA PCR SCREENING - Abnormal; Notable for the following components:      Result Value  MRSA by PCR POSITIVE (*)    All other components within normal limits  CBC WITH DIFFERENTIAL/PLATELET - Abnormal; Notable for the following components:   WBC 18.0 (*)    RBC 2.06 (*)    Hemoglobin 6.5 (*)    HCT 21.6 (*)    MCV 104.9 (*)    RDW 17.1 (*)    Neutro Abs 14.1 (*)    Monocytes Absolute 1.4 (*)    Abs Immature Granulocytes 0.15 (*)    All other components within normal limits  COMPREHENSIVE METABOLIC PANEL - Abnormal; Notable for the following components:   Chloride 96 (*)    CO2 35 (*)    Glucose, Bld 158 (*)    BUN 42 (*)    Calcium 8.0 (*)    Albumin 1.4 (*)    AST 108 (*)    ALT 116 (*)    Alkaline Phosphatase 186 (*)    All other components within normal limits  URINALYSIS, ROUTINE W REFLEX MICROSCOPIC - Abnormal; Notable for the following components:   Color, Urine AMBER (*)    APPearance CLOUDY (*)    Hgb urine dipstick LARGE (*)    Protein, ur 100 (*)    RBC / HPF >50 (*)    Bacteria, UA MANY (*)    All other components within normal limits  HEMOGLOBIN AND HEMATOCRIT, BLOOD - Abnormal; Notable for the following components:   Hemoglobin 8.6 (*)    HCT 28.0 (*)    All other components within normal limits  HEMOGLOBIN AND HEMATOCRIT, BLOOD - Abnormal; Notable for the following components:   Hemoglobin 7.9 (*)    HCT 25.5 (*)    All other components within normal limits  CBC WITH DIFFERENTIAL/PLATELET - Abnormal; Notable for the following components:   WBC 17.5 (*)    RBC 2.66 (*)    Hemoglobin 7.9 (*)    HCT 25.8 (*)     RDW 19.8 (*)    Neutro Abs 16.1 (*)    Lymphs Abs 0.6 (*)    Abs Immature Granulocytes 0.13 (*)    All other components within normal limits  STREP PNEUMONIAE URINARY ANTIGEN - Abnormal; Notable for the following components:   Strep Pneumo Urinary Antigen POSITIVE (*)    All other components within normal limits  GLUCOSE, CAPILLARY - Abnormal; Notable for the following components:   Glucose-Capillary 125 (*)    All other components within normal limits  POCT I-STAT 7, (LYTES, BLD GAS, ICA,H+H) - Abnormal; Notable for the following components:   pH, Arterial 7.341 (*)    pCO2 arterial 76.6 (*)    pO2, Arterial 459.0 (*)    Bicarbonate 41.4 (*)    TCO2 44 (*)    Acid-Base Excess 14.0 (*)    HCT 19.0 (*)    Hemoglobin 6.5 (*)    All other components within normal limits  POC OCCULT BLOOD, ED - Abnormal; Notable for the following components:   Fecal Occult Bld POSITIVE (*)    All other components within normal limits  POCT I-STAT 7, (LYTES, BLD GAS, ICA,H+H) - Abnormal; Notable for the following components:   pCO2 arterial 54.9 (*)    pO2, Arterial 67.0 (*)    Bicarbonate 30.8 (*)    Acid-Base Excess 5.0 (*)    HCT 26.0 (*)    Hemoglobin 8.8 (*)    All other components within normal limits  CULTURE, BLOOD (ROUTINE X 2)  CULTURE, BLOOD (ROUTINE X 2)  SARS CORONAVIRUS 2 (  TAT 6-12 HRS)  URINE CULTURE  CULTURE, RESPIRATORY  EXPECTORATED SPUTUM ASSESSMENT W REFEX TO RESP CULTURE  EXPECTORATED SPUTUM ASSESSMENT W REFEX TO RESP CULTURE  LACTIC ACID, PLASMA  GLUCOSE, CAPILLARY  CREATININE, SERUM  INFLUENZA PANEL BY PCR (TYPE A & B)  LEGIONELLA PNEUMOPHILA SEROGP 1 UR AG  GLUCOSE, CAPILLARY  GLUCOSE, CAPILLARY  PREPARE RBC (CROSSMATCH)  TYPE AND SCREEN  ABO/RH    EKG EKG Interpretation  Date/Time:  Wednesday October 27 2018 11:39:10 EDT Ventricular Rate:  99 PR Interval:    QRS Duration: 88 QT Interval:  325 QTC Calculation: 417 R Axis:   85 Text Interpretation:   Sinus rhythm Borderline right axis deviation Abnormal T, consider ischemia, lateral leads Confirmed by Julianne Rice [phone]) on 10/03/2018 11:52:53 AM   Radiology No results found.  Procedures Procedures (including critical care time)  Medications Ordered in ED Medications  0.9 %  sodium chloride infusion (0 mL/hr Intravenous Stopped 10/21/2018 1802)  sodium chloride 0.9 % bolus 1,000 mL (0 mLs Intravenous Stopped 10/26/2018 1530)  vancomycin (VANCOCIN) IVPB 1000 mg/200 mL premix (0 mg Intravenous Stopped 10/09/2018 1516)  piperacillin-tazobactam (ZOSYN) IVPB 3.375 g (0 g Intravenous Stopped 10/17/2018 1420)  iohexol (OMNIPAQUE) 300 MG/ML solution 100 mL (100 mLs Intravenous Contrast Given 10/08/2018 1411)  sodium chloride 0.9 % bolus 1,000 mL (0 mLs Intravenous Stopped 10/20/2018 1640)  vancomycin (VANCOCIN) 500 mg in sodium chloride 0.9 % 100 mL IVPB (500 mg Intravenous New Bag/Given 10/03/2018 2230)  albumin human 5 % solution 12.5 g ( Intravenous Paused November 09, 2018 0119)  norepinephrine (LEVOPHED) 4-5 MG/250ML-% infusion SOLN (  Duplicate 04/26/80 5003)   CRITICAL CARE Performed by: Julianne Rice Total critical care time: 35 minutes Critical care time was exclusive of separately billable procedures and treating other patients. Critical care was necessary to treat or prevent imminent or life-threatening deterioration. Critical care was time spent personally by me on the following activities: development of treatment plan with patient and/or surrogate as well as nursing, discussions with consultants, evaluation of patient's response to treatment, examination of patient, obtaining history from patient or surrogate, ordering and performing treatments and interventions, ordering and review of laboratory studies, ordering and review of radiographic studies, pulse oximetry and re-evaluation of patient's condition.  Initial Impression / Assessment and Plan / ED Course  I have reviewed the triage vital signs and  the nursing notes.  Pertinent labs & imaging results that were available during my care of the patient were reviewed by me and considered in my medical decision making (see chart for details).        Patient is evidence of sepsis and symptomatic anemia.  Stool is heme positive but brown. Initiated broad-spectrum antibiotics and IV fluids as well as typed and crossed for blood transfusion.  Persistent hypotension despite IV fluids and started on Levophed.  Discussed with critical care who will see patient emergency department and admit.  Final Clinical Impressions(s) / ED Diagnoses   Final diagnoses:  Gastrointestinal hemorrhage, unspecified gastrointestinal hemorrhage type  Sepsis, due to unspecified organism, unspecified whether acute organ dysfunction present ([place])  Hypotension, unspecified hypotension type    ED Discharge Orders    None       Julianne Rice, MD 11/01/18 2212

## 2018-10-27 NOTE — Progress Notes (Signed)
[facility] Progress Note Patient Name: Dave Abbott DOB: August 26, 1955 MRN: [id number]   Date of Service  10/06/2018  HPI/Events of Note  Pt admitted with pneumonia and sepsis. He is immunocompromised.  eICU Interventions  New patient evaluation completed  And orders entered.        Frederik Pear 10/30/2018, 9:49 PM

## 2018-10-27 NOTE — Progress Notes (Signed)
Patient transported to CT and back without complications. RN at bedside.  

## 2018-10-27 NOTE — ED Notes (Signed)
Pt does not consent to blood transfusion. MD informed.

## 2018-10-27 NOTE — H&P (Signed)
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 Admitted w/ new septic shock on 8/26  History of present illness   62 yo male with PMH significant for stage IIIB poorly diff T4N2MC Lung cancer. S/p XRT, (involving the left upper, left middle lobe and also had associated malignant effusion. had started on chemo prior to July 2020. Was initially admitted to outside hospital in July 2020 for STEMI. Treated w/ lytics at the time. Had subsequent cardiac arrest requiring ACLS (time to ROSC not certain), Was intubated and placed on mechanical ventilation. He was treated for pneumonia, subsequent septic shock and eventually required trach and PEG as was not able to come off vent. He was deemed no longer a candidate for chemo. Was resistant to DNR. Admitted to [facility] vent/SNF. Since arrival in end of July has remained vent dependent. Does get OOB eventually. Admitted w/ finding by staff w/ new hypotension. PCCM asked to admit.  In ER:  Wbc ct: 18.0, hgb 6.5, lactic acid 0.6. CT chest/abd pelvis w/ multifocal PNA. PCCM asked to admit. Working dx of sepsis/septic shock + w/ likely source PNA and symptomatic anemia. Heme +  Past Medical History   COPD, chronic respiratory failure, CAD, CHF, stage IIIB lung cancer, malignant left effusion. Pigtail cath had been removed. Vent dependent and not able to come off vent. PEG dependent   Significant Hospital Events   8/26 admitted w/ working dx of  sepsis/septic shock + w/ likely source PNA and symptomatic anemia. Heme +    Consults:  8/26: GI  Procedures:  Left port (PTA)>>> Trach (PTA)>>  Significant Diagnostic Tests:  8/26: CT Abdomen/Pelvis:. No acute findings noted in the abdomen or pelvis. 8/26:  CT Chest:  Multilobar bilateral pneumonia with bilateral empyemas, as detailed above. Underlying COPD  Micro Data:  8/26: Blood Cx  >> 8/26: UA >> 8/26: Respiratory culture >>  Antimicrobials:  8/26: Vancomycin 8/26: Zosyn  Interim history/subjective:  No distress.   Objective   Blood pressure (!) 83/63, pulse (!) 101, temperature 98.2 F (36.8 C), temperature source Axillary, resp. rate (!) 22, height 5\' 7"  (1.702 m), SpO2 100 %.    Vent Mode: PCV FiO2 (%):  [50 %-100 %] 50 % Set Rate:  [14 bmp-16 bmp] 16 bmp Vt Set:  [530 mL] 530 mL PEEP:  [5 cmH20] 5 cmH20 Pressure Support:  [10 cmH20] 10 cmH20 Plateau Pressure:  [23 cmH20] 23 cmH20  No intake or output data in the 24 hours ending 10/08/2018 1525 There were no vitals filed for this visit.  Examination: General: frail 63 year old black male currently on full vent support.  HENT: trach unremarkable. Temporal wasting MMM Lungs: wheezing t/o right. Left decreased t/o Cardiovascular: tachy rrr  Abdomen: soft not tender PEG unremarkable  Extremities: warm and dry bilateral foot drop  Neuro: awake and alert no focal def  GU: voiding   Resolved Hospital Problem list     Assessment & Plan:   Severe sepsis/septic shock; possibly c/b GIB W/ source likely VAP but could also be post-obstructive process from known left sided lung cancer.  -abd films non-diagnostic. Had heme-+ stool AND hgb 6.5 so this is probably also contributing.  Plan Cont IVFs Agree w/ tranfusion  Pan culture  Holding any antihypertensives or diuretics  IV vanc/zosyn Admit to ICU Would refrain from sampling left pleural space as this is chronic and suspect that the lung is trapped.   Chronic Hypoxic respiratory failure in setting of advanced emphysema and further c/b extensive stage IIIB poorly diff T4N2MC of the lung involving the LUL, LML and chronic left effusion Plan Cont full vent support Scheduled BDs PAD protocol VAP bundle  Infection rx as  above   Anemia w/ hemoccult positive stool (not sure if this is symptomatic or if BP being driven by sepsis) Plan GI consulted PPI Trend cbc  H/o CAD and HTN Plan Holding anticoagulation, antihypertensives and diuretics    Best practice:  Diet: tubefeeds Pain/Anxiety/Delirium protocol (if indicated): pad protocol VAP protocol (if indicated): 8/26 DVT prophylaxis: scd GI prophylaxis: ppi Glucose control: ssi Mobility: bedrest Code Status: full code  Family Communication: pending  Disposition: admit to ICU for volume resuscitation, abx etc   Labs   CBC: Recent Labs  Lab 10/05/2018 1141 10/23/2018 1241  WBC 18.0*  --   NEUTROABS 14.1*  --   HGB 6.5* 6.5*  HCT 21.6* 19.0*  MCV 104.9*  --   PLT 235  --     Basic Metabolic Panel: Recent Labs  Lab 10/17/2018 1141 10/05/2018 1241  NA 138 138  K 5.0 5.0  CL 96*  --   CO2 35*  --   GLUCOSE 158*  --   BUN 42*  --   CREATININE 0.94  --   CALCIUM 8.0*  --    GFR: CrCl cannot be calculated (Unknown ideal weight.). Recent Labs  Lab 10/25/2018 1141 10/08/2018 1153  WBC 18.0*  --   LATICACIDVEN  --  0.6    Liver Function Tests: Recent Labs  Lab 10/25/2018 1141  AST 108*  ALT 116*  ALKPHOS 186*  BILITOT 0.7  PROT 7.3  ALBUMIN 1.4*   No results for input(s): LIPASE, AMYLASE in the last 168 hours. No results for input(s): AMMONIA in the last 168 hours.  ABG    Component Value Date/Time   PHART 7.341 (L) 10/26/2018 1241   PCO2ART 76.6 (HH) 10/26/2018 1241   PO2ART 459.0 (H) 10/07/2018 1241   HCO3 41.4 (H) 10/06/2018 1241   TCO2 44 (H) 10/22/2018 1241   O2SAT 100.0 10/20/2018 1241     Coagulation Profile: No results for input(s): INR, PROTIME in the last 168 hours.  Cardiac Enzymes: No results for input(s): CKTOTAL, CKMB, CKMBINDEX, TROPONINI in the last 168 hours.  HbA1C: No results found for: HGBA1C  CBG: No results for input(s): GLUCAP in the last 168 hours.  Review of Systems:   Not able   Past  Medical History  He,  has a past medical history of Cardiac arrest ([place]), COPD (chronic obstructive pulmonary disease) ([place]), Lung cancer ([place]), MI, acute, non ST segment elevation ([place]), PEA (Pulseless electrical activity) ([place]), and Ventilator dependence ([place]).   Surgical History   PEG and trach    Social History    resides at SNF  Family History   His family history is not on file.   Allergies No Known Allergies   Home Medications  Prior to Admission medications   Not on File     Critical care time:  38 minutes.     Erick Colace ACNP-BC [place] Pager # [phone] OR # [phone] if no answer

## 2018-10-27 NOTE — Progress Notes (Signed)
Patient switched from SIMV/PRVC to pressure control because of peak pressures in the 40's. Patient tolerating this mode better and getting good volumes. RT will monitor.

## 2018-10-27 NOTE — ED Notes (Signed)
Pt has consented to blood.

## 2018-10-27 NOTE — ED Triage Notes (Addendum)
Per EMS- pt arrives from [facility], full code, and vent dependent. Pt is here for eval of hypotension of unknown cause. BP 80s, given 1 L of fluid.

## 2018-10-27 NOTE — Progress Notes (Signed)
Pharmacy Antibiotic Note  Marjorie Lussier is a 63 y.o. male admitted on 10/14/2018 with pneumonia.  Pharmacy has been consulted for Vancomycin dosing.  Vancomycin 1250 mg IV Q 24 hrs. Goal AUC 400-550. Expected AUC: 476 SCr used: 0.94  Plan: Vanc 1 gram given in the ED.  Will give an additional 500 mg IV x 1 tonight for a total of 1500 mg today and then start 1250 mg IV q24hr. Monitor renal function, clinical status, C&S, and vanc levels as needed.  Height: 5\' 7"  (170.2 cm) Weight: 118 lb 13.3 oz (53.9 kg) IBW/kg (Calculated) : 66.1  Temp (24hrs), Avg:98.1 F (36.7 C), Min:97.5 F (36.4 C), Max:98.9 F (37.2 C)  Recent Labs  Lab 10/06/2018 1141 10/17/2018 1153  WBC 18.0*  --   CREATININE 0.94  --   LATICACIDVEN  --  0.6    Estimated Creatinine Clearance: 62.1 mL/min (by C-G formula based on SCr of 0.94 mg/dL).    Allergies  Allergen Reactions  . Latex     unknown  . Madelaine Bhat Isothiocyanate]     unknown    Antimicrobials this admission: Vanc 8/26 >>  Zosyn 8/26 >>  Thank you for allowing pharmacy to be a part of this patient's care.  Alanda Slim, PharmD, [facility] Clinical Pharmacist Please see AMION for all Pharmacists' Contact Phone Numbers 10/21/2018, 10:04 PM

## 2018-10-28 DIAGNOSIS — Z7189 Other specified counseling: Secondary | ICD-10-CM

## 2018-10-28 DIAGNOSIS — Z515 Encounter for palliative care: Secondary | ICD-10-CM

## 2018-10-28 DIAGNOSIS — K922 Gastrointestinal hemorrhage, unspecified: Secondary | ICD-10-CM

## 2018-10-28 LAB — BPAM RBC
Blood Product Expiration Date: 202009022359
Blood Product Expiration Date: 202009022359
ISSUE DATE / TIME: 202008261410
ISSUE DATE / TIME: 202008261744
Unit Type and Rh: 5100
Unit Type and Rh: 5100

## 2018-10-28 LAB — STREP PNEUMONIAE URINARY ANTIGEN: Strep Pneumo Urinary Antigen: POSITIVE — AB

## 2018-10-28 LAB — POCT I-STAT 7, (LYTES, BLD GAS, ICA,H+H)
Acid-Base Excess: 5 mmol/L — ABNORMAL HIGH (ref 0.0–2.0)
Bicarbonate: 30.8 mmol/L — ABNORMAL HIGH (ref 20.0–28.0)
Calcium, Ion: 1.23 mmol/L (ref 1.15–1.40)
HCT: 26 % — ABNORMAL LOW (ref 39.0–52.0)
Hemoglobin: 8.8 g/dL — ABNORMAL LOW (ref 13.0–17.0)
O2 Saturation: 90 %
Patient temperature: 102
Potassium: 4.3 mmol/L (ref 3.5–5.1)
Sodium: 142 mmol/L (ref 135–145)
TCO2: 32 mmol/L (ref 22–32)
pCO2 arterial: 54.9 mmHg — ABNORMAL HIGH (ref 32.0–48.0)
pH, Arterial: 7.366 (ref 7.350–7.450)
pO2, Arterial: 67 mmHg — ABNORMAL LOW (ref 83.0–108.0)

## 2018-10-28 LAB — TYPE AND SCREEN
ABO/RH(D): O POS
Antibody Screen: NEGATIVE
Unit division: 0
Unit division: 0

## 2018-10-28 LAB — URINE CULTURE: Culture: NO GROWTH

## 2018-10-28 LAB — GLUCOSE, CAPILLARY
Glucose-Capillary: 91 mg/dL (ref 70–99)
Glucose-Capillary: 125 mg/dL — ABNORMAL HIGH (ref 70–99)

## 2018-10-28 LAB — HEMOGLOBIN AND HEMATOCRIT, BLOOD
HCT: 25.5 % — ABNORMAL LOW (ref 39.0–52.0)
Hemoglobin: 7.9 g/dL — ABNORMAL LOW (ref 13.0–17.0)

## 2018-10-28 LAB — INFLUENZA PANEL BY PCR (TYPE A & B)
Influenza A By PCR: NEGATIVE
Influenza B By PCR: NEGATIVE

## 2018-10-28 LAB — CBC WITH DIFFERENTIAL/PLATELET
Abs Immature Granulocytes: 0.13 10*3/uL — ABNORMAL HIGH (ref 0.00–0.07)
Basophils Absolute: 0 10*3/uL (ref 0.0–0.1)
Basophils Relative: 0 %
Eosinophils Absolute: 0.1 10*3/uL (ref 0.0–0.5)
Eosinophils Relative: 1 %
HCT: 25.8 % — ABNORMAL LOW (ref 39.0–52.0)
Hemoglobin: 7.9 g/dL — ABNORMAL LOW (ref 13.0–17.0)
Immature Granulocytes: 1 %
Lymphocytes Relative: 3 %
Lymphs Abs: 0.6 10*3/uL — ABNORMAL LOW (ref 0.7–4.0)
MCH: 29.7 pg (ref 26.0–34.0)
MCHC: 30.6 g/dL (ref 30.0–36.0)
MCV: 97 fL (ref 80.0–100.0)
Monocytes Absolute: 0.6 10*3/uL (ref 0.1–1.0)
Monocytes Relative: 3 %
Neutro Abs: 16.1 10*3/uL — ABNORMAL HIGH (ref 1.7–7.7)
Neutrophils Relative %: 92 %
Platelets: 187 10*3/uL (ref 150–400)
RBC: 2.66 MIL/uL — ABNORMAL LOW (ref 4.22–5.81)
RDW: 19.8 % — ABNORMAL HIGH (ref 11.5–15.5)
WBC Morphology: INCREASED
WBC: 17.5 10*3/uL — ABNORMAL HIGH (ref 4.0–10.5)
nRBC: 0 % (ref 0.0–0.2)

## 2018-10-28 MED ORDER — MORPHINE 100MG IN NS 100ML (1MG/ML) PREMIX INFUSION
1.0000 mg/h | INTRAVENOUS | Status: DC
  Administered 2018-10-28: 10:00:00 2 mg/h via INTRAVENOUS
  Filled 2018-10-28: qty 100

## 2018-10-28 MED ORDER — GLYCOPYRROLATE 0.2 MG/ML IJ SOLN: 0.2000 mg | INTRAMUSCULAR | Status: DC | PRN

## 2018-10-28 MED ORDER — NOREPINEPHRINE 4 MG/250ML-% IV SOLN
INTRAVENOUS | Status: AC
  Filled 2018-10-28: qty 250

## 2018-10-28 MED ORDER — GLYCOPYRROLATE 1 MG PO TABS: 1.0000 mg | ORAL_TABLET | ORAL | Status: DC | PRN

## 2018-10-28 MED ORDER — MORPHINE BOLUS VIA INFUSION
5.0000 mg | INTRAVENOUS | Status: DC | PRN
  Filled 2018-10-28: qty 5

## 2018-10-28 MED ORDER — POLYVINYL ALCOHOL 1.4 % OP SOLN
1.0000 [drp] | Freq: Four times a day (QID) | OPHTHALMIC | Status: DC | PRN
  Filled 2018-10-28: qty 15

## 2018-10-28 MED ORDER — MORPHINE 100MG IN NS 100ML (1MG/ML) PREMIX INFUSION: 0.0000 mg/h | INTRAVENOUS | Status: DC

## 2018-10-28 MED ORDER — ACETAMINOPHEN 160 MG/5ML PO SOLN
650.0000 mg | ORAL | Status: DC | PRN
  Administered 2018-10-28: 650 mg
  Filled 2018-10-28: qty 20.3

## 2018-10-28 MED ORDER — ACETAMINOPHEN 650 MG RE SUPP: 650.0000 mg | Freq: Four times a day (QID) | RECTAL | Status: DC | PRN

## 2018-10-28 MED ORDER — DIPHENHYDRAMINE HCL 50 MG/ML IJ SOLN: 25.0000 mg | INTRAMUSCULAR | Status: DC | PRN

## 2018-10-28 MED ORDER — SODIUM CHLORIDE 0.9 % IV BOLUS: 2000.0000 mL | Freq: Once | INTRAVENOUS | Status: DC

## 2018-10-28 MED ORDER — LIP MEDEX EX OINT
TOPICAL_OINTMENT | CUTANEOUS | Status: DC | PRN
  Filled 2018-10-28: qty 7

## 2018-10-28 MED ORDER — MORPHINE SULFATE (PF) 2 MG/ML IV SOLN: 2.0000 mg | INTRAVENOUS | Status: DC | PRN

## 2018-10-28 MED ORDER — ACETAMINOPHEN 325 MG PO TABS: 650.0000 mg | ORAL_TABLET | Freq: Four times a day (QID) | ORAL | Status: DC | PRN

## 2018-10-28 MED ORDER — NOREPINEPHRINE 4 MG/250ML-% IV SOLN
0.0000 ug/min | INTRAVENOUS | Status: DC
  Administered 2018-10-28: 4 ug/min via INTRAVENOUS

## 2018-10-28 MED ORDER — DEXTROSE 5 % IV SOLN: INTRAVENOUS | Status: DC

## 2018-10-28 MED ORDER — ALBUMIN HUMAN 5 % IV SOLN
12.5000 g | Freq: Once | INTRAVENOUS | Status: AC
  Administered 2018-10-28: 12.5 g via INTRAVENOUS
  Filled 2018-10-28: qty 250

## 2018-10-29 LAB — LEGIONELLA PNEUMOPHILA SEROGP 1 UR AG: L. pneumophila Serogp 1 Ur Ag: NEGATIVE

## 2018-11-01 LAB — CULTURE, BLOOD (ROUTINE X 2)
Culture: NO GROWTH
Culture: NO GROWTH
Special Requests: ADEQUATE

## 2018-11-01 LAB — CULTURE, RESPIRATORY W GRAM STAIN

## 2018-11-02 NOTE — Consult Note (Addendum)
[place] Nurse wound consult note Reason for Consult:Consult requested for penis and sacrum.  [place] team is performing consults remotely today; reviewed progress notes and nursing wound care flowsheets.  Wound type: Anterior penis is reported to have a full thickness wound, pink and dry of unknown etiology; this location is not consistent with a pressure injury.  3X1cm.   Measurement: Sacrum is reported to have a healing pressure injury; 4X2cm pink and dry and shallow.  This was present on admission. Dressing procedure/placement/frequency: Foam dressing to protect and promote healing to sacrum wound.   Xeroform gauze to promote moist healing to penis; location is difficult to have a dressing remain in place. Please re-consult if further assistance is needed.  Thank-you,  Julien Girt MSN, [place], [place], [place], [place]

## 2018-11-02 NOTE — Progress Notes (Signed)
Family arrived and visited.  Ready for withdrawal.  Withdrawal orders placed.  RN to notify family prior to extubation.  Rush Farmer, M.D. [facility] Pulmonary/Critical Care Medicine. Pager: [phone]. After hours pager: [phone]

## 2018-11-02 NOTE — Progress Notes (Signed)
Wasted 25 cc of fentanyl with Aldona Bar, RN

## 2018-11-02 NOTE — Progress Notes (Signed)
Nutrition Brief Note  Dietitian Consult received for Enteral Nutrition. Chart reviewed. Pt now transitioning to comfort care.  No further nutrition interventions warranted at this time.  Please re-consult as needed.   [employer] MS, RDN, LDN, CNSC [phone] Pager  [phone] Weekend/On-Call Pager

## 2018-11-02 NOTE — Progress Notes (Signed)
eLink Physician-Brief Progress Note Patient Name: Dave Abbott DOB: 19-Apr-1955 MRN: [id number]   Date of Service  2018-11-27  HPI/Events of Note  Hypotension despite cautious volume resuscitation.  eICU Interventions  Levophed infusion ordered.        Kerry Kass Valli Randol 2018-11-27, 3:40 AM

## 2018-11-02 NOTE — Progress Notes (Signed)
Spoke with Efraim Kaufmann who is the patient niece and POA.  After a long discussion, decision was made to transition to comfort care with full DNR and start morphine drip.  Niece is to come to see patient.  The patient is critically ill with multiple organ systems failure and requires high complexity decision making for assessment and support, frequent evaluation and titration of therapies, application of advanced monitoring technologies and extensive interpretation of multiple databases.   Critical Care Time devoted to patient care services described in this note is  60  Minutes. This time reflects time of care of this signee Dr Jennet Maduro. This critical care time does not reflect procedure time, or teaching time or supervisory time of PA/NP/Med student/Med Resident etc but could involve care discussion time.  Rush Farmer, M.D. [facility] Pulmonary/Critical Care Medicine. Pager: [phone]. After hours pager: [phone].

## 2018-11-02 NOTE — H&P (Signed)
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 Admitted w/ new septic shock on 8/26  History of present illness   63 yo male with PMH significant for stage IIIB poorly diff T4N2MC Lung cancer. S/p XRT, (involving the left upper, left middle lobe and also had associated malignant effusion. had started on chemo prior to July 2020. Was initially admitted to outside hospital in July 2020 for STEMI. Treated w/ lytics at the time. Had subsequent cardiac arrest requiring ACLS (time to ROSC not certain), Was intubated and placed on mechanical ventilation. He was treated for pneumonia, subsequent septic shock and eventually required trach and PEG as was not able to come off vent. He was deemed no longer a candidate for chemo. Was resistant to DNR. Admitted to [facility] vent/SNF. Since arrival in end of July has remained vent dependent. Does get OOB eventually. Admitted w/ finding by staff w/ new hypotension. PCCM asked to admit.  In ER:  Wbc ct: 18.0, hgb 6.5, lactic acid 0.6. CT chest/abd pelvis w/ multifocal PNA. PCCM asked to admit. Working dx of sepsis/septic shock + w/ likely source PNA and symptomatic anemia. Heme +   Past Medical History   COPD, chronic respiratory failure, CAD, CHF, stage IIIB lung cancer, malignant left effusion. Pigtail cath had been removed. Vent dependent and not able to come off vent. PEG dependent   Significant Hospital Events   8/26 admitted w/ working dx of  sepsis/septic shock + w/ likely source PNA and symptomatic anemia. Heme +    Consults:  8/26: GI  Procedures:  Left port (PTA)>>> Trach (PTA)>>  Significant Diagnostic Tests:  8/26: CT Abdomen/Pelvis:. No acute findings noted in the abdomen or pelvis.  8/26: CT Chest:  Multilobar bilateral pneumonia with bilateral empyemas, as detailed above. Underlying COPD  Micro Data:  8/26: Blood Cx  >> 8/26: UA >> 8/26: Respiratory culture >>  Antimicrobials:  8/26: Vancomycin 8/26: Zosyn  Interim history/subjective:  No events overnight, remains on full vent support  Objective   Blood pressure 105/69, pulse 87, temperature 98.5 F (36.9 C), temperature source Oral, resp. rate (!) 28, height 5\' 7"  (1.702 m), weight 55.4 kg, SpO2 99 %.    Vent Mode: PCV FiO2 (%):  [40 %-100 %] 50 % Set Rate:  [14 bmp-16 bmp] 16 bmp Vt Set:  [530 mL] 530 mL PEEP:  [5 cmH20] 5 cmH20 Pressure Support:  [10 cmH20] 10 cmH20 Plateau Pressure:  [23 cmH20-34 cmH20] 32 cmH20   Intake/Output Summary (Last 24 hours) at 2018/11/19 0845 Last data filed at 11/19/18 0600 Gross per 24 hour  Intake 4095.89 ml  Output 650 ml  Net 3445.89 ml   Filed Weights   10/13/2018 2133 Nov 19, 2018 0354  Weight: 53.9 kg 55.4 kg   Examination: General: frail 63 year old black male currently on full vent support.  HENT: trach unremarkable. Temporal wasting MMM Lungs: wheezing t/o right. Left decreased t/o Cardiovascular: tachy rrr  Abdomen: soft not tender PEG unremarkable  Extremities: warm and dry bilateral foot drop  Neuro: awake and alert no focal def  GU: voiding   I reviewed chest CT myself, masses noted and trach in a good position  Resolved Hospital Problem list  Assessment & Plan:   Severe sepsis/septic shock; possibly c/b GIB W/ source likely VAP but could also be post-obstructive process from known left sided lung cancer.  -abd films non-diagnostic. Had heme-+ stool AND hgb 6.5 so this is probably also contributing.  Plan 2L NS bolus Attempt to get off pressors today Hold further transfusion at this point Pan culture f/u Holding any antihypertensives or diuretics  IV vanc/zosyn No thora, no sampling Levophed for BP support  Chronic Hypoxic  respiratory failure in setting of advanced emphysema and further c/b extensive stage IIIB poorly diff T4N2MC of the lung involving the LUL, LML and chronic left effusion Plan Change to PRVC from PC ABG and CXR to PRN at this point Scheduled BDs PAD protocol VAP bundle  Infection rx as above   Anemia w/ hemoccult positive stool (not sure if this is symptomatic or if BP being driven by sepsis) Plan GI consult called this AM PPI Trend cbc  H/o CAD and HTN Plan Holding anticoagulation, antihypertensives and diuretics    Best practice:  Diet: tubefeeds Pain/Anxiety/Delirium protocol (if indicated): pad protocol VAP protocol (if indicated): 8/26 DVT prophylaxis: scd GI prophylaxis: ppi Glucose control: ssi Mobility: bedrest Code Status: full code  Family Communication: pending  Disposition: admit to ICU for volume resuscitation, abx etc   Labs   CBC: Recent Labs  Lab 10/07/2018 1141 10/31/2018 1241 10/09/2018 2000 23-Nov-2018 0358 11/23/2018 0437  WBC 18.0*  --   --   --   --   NEUTROABS 14.1*  --   --   --   --   HGB 6.5* 6.5* 8.6* 7.9* 8.8*  HCT 21.6* 19.0* 28.0* 25.5* 26.0*  MCV 104.9*  --   --   --   --   PLT 235  --   --   --   --     Basic Metabolic Panel: Recent Labs  Lab 10/12/2018 1141 10/03/2018 1241 10/26/2018 2138 November 23, 2018 0437  NA 138 138  --  142  K 5.0 5.0  --  4.3  CL 96*  --   --   --   CO2 35*  --   --   --   GLUCOSE 158*  --   --   --   BUN 42*  --   --   --   CREATININE 0.94  --  0.79  --   CALCIUM 8.0*  --   --   --    GFR: Estimated Creatinine Clearance: 75 mL/min (by C-G formula based on SCr of 0.79 mg/dL). Recent Labs  Lab 10/05/2018 1141 10/23/2018 1153  WBC 18.0*  --   LATICACIDVEN  --  0.6    Liver Function Tests: Recent Labs  Lab 10/20/2018 1141  AST 108*  ALT 116*  ALKPHOS 186*  BILITOT 0.7  PROT 7.3  ALBUMIN 1.4*   No results for input(s): LIPASE, AMYLASE in the last 168 hours. No results for input(s): AMMONIA in the last 168  hours.  ABG    Component Value Date/Time   PHART 7.366 11-23-18 0437   PCO2ART 54.9 (H) 11-23-2018 0437   PO2ART 67.0 (L) 11/23/18 0437   HCO3 30.8 (H) 23-Nov-2018 0437   TCO2 32 11/23/18 0437   O2SAT 90.0 November 23, 2018 0437     Coagulation Profile: No results for input(s): INR, PROTIME in the last 168 hours.  Cardiac Enzymes: No results for input(s): CKTOTAL, CKMB, CKMBINDEX, TROPONINI in the last 168 hours.  HbA1C: No results found for: HGBA1C  CBG: Recent Labs  Lab 10/16/2018 1949 10/15/2018 2331 11/23/18 0348 11/23/18 0728  GLUCAP 95 78 91 125*   The patient is critically ill with multiple organ systems failure and requires high complexity decision making for assessment and support, frequent evaluation and titration of therapies, application of advanced monitoring technologies and extensive interpretation of multiple databases.   Critical Care Time devoted to patient care services described in this note is  32  Minutes. This time reflects time of care of this signee Dr Jennet Maduro. This critical care time does not reflect procedure time, or teaching time or supervisory time of PA/NP/Med student/Med Resident etc but could involve care discussion time.  Rush Farmer, M.D. [facility] Pulmonary/Critical Care Medicine. Pager: [phone]. After hours pager: [phone].

## 2018-11-02 NOTE — Progress Notes (Signed)
eLink Physician-Brief Progress Note Patient Name: Dave Abbott DOB: Mar 11, 1955 MRN: [id number]   Date of Service  Nov 19, 2018  HPI/Events of Note  Fever  eICU Interventions  Tylenol prn order entered.        [name] November 19, 2018, 4:07 AM

## 2018-11-02 NOTE — Procedures (Signed)
Extubation Procedure Note  Patient Details:   Name: Dave Abbott DOB: 1956/02/28 MRN: [id number]   Airway Documentation:    Vent end date: November 03, 2018 Vent end time: 1810   Evaluation  O2 sats: Immediately decreased after terminating ventilator support. Complications: No apparent complications due to patient being comfort care. Patient did not tolerate procedure well. Bilateral Breath Sounds: Rhonchi   No   Withdrew patient from ventilator support with RN at the bedside. Patient is comfortable at this time & is not having any difficulty breathing.   Dave Abbott, Eddie North 11-03-2018, 6:10 PM

## 2018-11-02 NOTE — Progress Notes (Signed)
Called patient's niece (POA) to proceed forward with withdrawal. I explained that respiratory will disconnect the patient from the ventilator. She stated that is why she came today and that it is okay to proceed to withdraw and remove patient from the ventilator.

## 2018-11-02 DEATH — deceased

## 2018-12-02 NOTE — Death Summary Note (Signed)
DEATH SUMMARY   Patient Details  Name: Dave Abbott MRN: [id number] DOB: 1955-08-23  Admission/Discharge Information   Admit Date:  10/30/18  Date of Death: Date of Death: 2018-10-31  Time of Death: Time of Death: 05/14/1831  Length of Stay: 1  Referring Physician: Townsend Roger, MD   Reason(s) for Hospitalization  Septic shock  Diagnoses  Preliminary cause of death:   Septic shock Secondary Diagnoses (including complications and co-morbidities):  Active Problems:   GIB (gastrointestinal bleeding) Acute kidney injury Acute respiratory failure Hemorrhagic anemia  Brief Hospital Course (including significant findings, care, treatment, and services provided and events leading to death)  63 year old male w/ advanced COPD, 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 Admitted w/ new septic shock on 2022-10-30.  Patient was cared for in the ICU and failed to make any progress.    Spoke with Efraim Kaufmann who is the patient niece and POA.  After a long discussion, decision was made to transition to comfort care with full DNR and start morphine drip.  Niece is to come to see patient.  Family arrived and visited.  Ready for withdrawal.  Withdrawal orders placed.  Patient was extubated to expire shortly thereafter comfortable with the family bedside.  Pertinent Labs and Studies  Significant Diagnostic Studies Ct Chest W Contrast  Result Date: 10-30-2018 CLINICAL DATA:  63 year old male with history of unresolved pneumonia. EXAM: CT CHEST, ABDOMEN, AND PELVIS WITH CONTRAST TECHNIQUE: Multidetector CT imaging of the chest, abdomen and pelvis was performed following the standard protocol during bolus administration of intravenous contrast. CONTRAST:  137mL OMNIPAQUE IOHEXOL 300 MG/ML  SOLN COMPARISON:  No priors. FINDINGS: CT CHEST FINDINGS Cardiovascular: Heart size is normal. There is no significant pericardial fluid, thickening or pericardial  calcification. There is aortic atherosclerosis, as well as atherosclerosis of the great vessels of the mediastinum and the coronary arteries, including calcified atherosclerotic plaque in the left anterior descending coronary artery. Right internal jugular single-lumen porta cath with tip terminating in the mid superior vena cava. Mediastinum/Nodes: Tracheostomy tube in position with tip in the mid trachea. Borderline enlarged subcarinal lymph node measuring 1.1 cm in short axis. No other pathologically enlarged mediastinal or hilar lymph nodes. Esophagus is unremarkable in appearance. No axillary lymphadenopathy. Lungs/Pleura: Patchy areas of airspace consolidation are noted in the lungs bilaterally, most confluent in the right middle lobe where there is also extensive volume loss. Several other nodular appearing areas are noted, largest of which includes a 1.7 x 0.9 cm area in the right lower lobe (axial image 97 of series 5). Large complex loculated left pleural fluid collection in the mid to upper left hemithorax with peripheral rim enhancement, most compatible with a large empyema measuring 13.8 x 10.0 x 4.7 cm (axial image 33 of series 4 and coronal image 63 of series 6). Other smaller complex loculated rim enhancing fluid collections are noted bilaterally, largest of which is in the subpulmonic space in the lower right hemithorax (axial image 58 of series 4 and coronal image 49 of series 6) measuring 11.3 x 6.6 x 3.9 cm. Near complete atelectasis of much of the left lung with some aerated portions in the basal segments of the left lower lobe where there is extensive atelectasis and patchy airspace consolidation. Diffuse bronchial wall thickening with severe centrilobular and paraseptal emphysema. Musculoskeletal: There are no aggressive appearing lytic or blastic lesions noted in the visualized portions of the skeleton. CT ABDOMEN PELVIS FINDINGS Hepatobiliary: No  suspicious cystic or solid hepatic lesions.  No intra or extrahepatic biliary ductal dilatation. Gallbladder is nearly decompressed, but otherwise unremarkable in appearance. Pancreas: No pancreatic mass. No pancreatic ductal dilatation. No pancreatic or peripancreatic fluid collections or inflammatory changes. Spleen: Unremarkable. Adrenals/Urinary Tract: Bilateral adrenal glands are normal in appearance. Low-attenuation lesions in both kidneys, compatible with simple cysts, largest of which measure up to 3.6 x 2.7 cm in the upper pole of left kidney. In addition, in the interpolar region of the left kidney there is a 8.7 x 7.5 cm low-attenuation lesion with a thin internal septation, compatible with a large Bosniak class 2 cyst. No suspicious renal lesions. Subcentimeter low-attenuation lesions in the right kidney, too small to definitively characterize, but statistically likely to represent tiny cysts. No hydroureteronephrosis. Foley balloon catheter in the urinary bladder. Gas in the nondependent portion of the urinary bladder, presumably iatrogenic. Urinary bladder is otherwise unremarkable in appearance. Stomach/Bowel: Percutaneous gastrostomy tube in the distal body of the stomach. No pathologic dilatation of small bowel or colon. Normal appendix. Vascular/Lymphatic: Aortic atherosclerosis, without evidence of aneurysm or dissection in the abdominal or pelvic vasculature. No lymphadenopathy noted in the abdomen or pelvis. Reproductive: Prostate gland and seminal vesicles are unremarkable in appearance. Other: No significant volume of ascites.  No pneumoperitoneum. Musculoskeletal: There are no aggressive appearing lytic or blastic lesions noted in the visualized portions of the skeleton. IMPRESSION: 1. Multilobar bilateral pneumonia with bilateral empyemas, as detailed above. 2. No acute findings noted in the abdomen or pelvis. 3. Diffuse bronchial wall thickening with severe centrilobular and paraseptal emphysema; imaging findings suggestive of  underlying COPD. 4. Aortic atherosclerosis, in addition to left anterior descending coronary artery disease. Please note that although the presence of coronary artery calcium documents the presence of coronary artery disease, the severity of this disease and any potential stenosis cannot be assessed on this non-gated CT examination. Assessment for potential risk factor modification, dietary therapy or pharmacologic therapy may be warranted, if clinically indicated. 5. Additional incidental findings, as above. Electronically Signed   By: Vinnie Langton M.D.   On: 10/10/2018 14:55   Ct Abdomen Pelvis W Contrast  Result Date: 11/01/2018 CLINICAL DATA:  63 year old male with history of unresolved pneumonia. EXAM: CT CHEST, ABDOMEN, AND PELVIS WITH CONTRAST TECHNIQUE: Multidetector CT imaging of the chest, abdomen and pelvis was performed following the standard protocol during bolus administration of intravenous contrast. CONTRAST:  130mL OMNIPAQUE IOHEXOL 300 MG/ML  SOLN COMPARISON:  No priors. FINDINGS: CT CHEST FINDINGS Cardiovascular: Heart size is normal. There is no significant pericardial fluid, thickening or pericardial calcification. There is aortic atherosclerosis, as well as atherosclerosis of the great vessels of the mediastinum and the coronary arteries, including calcified atherosclerotic plaque in the left anterior descending coronary artery. Right internal jugular single-lumen porta cath with tip terminating in the mid superior vena cava. Mediastinum/Nodes: Tracheostomy tube in position with tip in the mid trachea. Borderline enlarged subcarinal lymph node measuring 1.1 cm in short axis. No other pathologically enlarged mediastinal or hilar lymph nodes. Esophagus is unremarkable in appearance. No axillary lymphadenopathy. Lungs/Pleura: Patchy areas of airspace consolidation are noted in the lungs bilaterally, most confluent in the right middle lobe where there is also extensive volume loss. Several  other nodular appearing areas are noted, largest of which includes a 1.7 x 0.9 cm area in the right lower lobe (axial image 97 of series 5). Large complex loculated left pleural fluid collection in the mid to upper left hemithorax with peripheral  rim enhancement, most compatible with a large empyema measuring 13.8 x 10.0 x 4.7 cm (axial image 33 of series 4 and coronal image 63 of series 6). Other smaller complex loculated rim enhancing fluid collections are noted bilaterally, largest of which is in the subpulmonic space in the lower right hemithorax (axial image 58 of series 4 and coronal image 49 of series 6) measuring 11.3 x 6.6 x 3.9 cm. Near complete atelectasis of much of the left lung with some aerated portions in the basal segments of the left lower lobe where there is extensive atelectasis and patchy airspace consolidation. Diffuse bronchial wall thickening with severe centrilobular and paraseptal emphysema. Musculoskeletal: There are no aggressive appearing lytic or blastic lesions noted in the visualized portions of the skeleton. CT ABDOMEN PELVIS FINDINGS Hepatobiliary: No suspicious cystic or solid hepatic lesions. No intra or extrahepatic biliary ductal dilatation. Gallbladder is nearly decompressed, but otherwise unremarkable in appearance. Pancreas: No pancreatic mass. No pancreatic ductal dilatation. No pancreatic or peripancreatic fluid collections or inflammatory changes. Spleen: Unremarkable. Adrenals/Urinary Tract: Bilateral adrenal glands are normal in appearance. Low-attenuation lesions in both kidneys, compatible with simple cysts, largest of which measure up to 3.6 x 2.7 cm in the upper pole of left kidney. In addition, in the interpolar region of the left kidney there is a 8.7 x 7.5 cm low-attenuation lesion with a thin internal septation, compatible with a large Bosniak class 2 cyst. No suspicious renal lesions. Subcentimeter low-attenuation lesions in the right kidney, too small to  definitively characterize, but statistically likely to represent tiny cysts. No hydroureteronephrosis. Foley balloon catheter in the urinary bladder. Gas in the nondependent portion of the urinary bladder, presumably iatrogenic. Urinary bladder is otherwise unremarkable in appearance. Stomach/Bowel: Percutaneous gastrostomy tube in the distal body of the stomach. No pathologic dilatation of small bowel or colon. Normal appendix. Vascular/Lymphatic: Aortic atherosclerosis, without evidence of aneurysm or dissection in the abdominal or pelvic vasculature. No lymphadenopathy noted in the abdomen or pelvis. Reproductive: Prostate gland and seminal vesicles are unremarkable in appearance. Other: No significant volume of ascites.  No pneumoperitoneum. Musculoskeletal: There are no aggressive appearing lytic or blastic lesions noted in the visualized portions of the skeleton. IMPRESSION: 1. Multilobar bilateral pneumonia with bilateral empyemas, as detailed above. 2. No acute findings noted in the abdomen or pelvis. 3. Diffuse bronchial wall thickening with severe centrilobular and paraseptal emphysema; imaging findings suggestive of underlying COPD. 4. Aortic atherosclerosis, in addition to left anterior descending coronary artery disease. Please note that although the presence of coronary artery calcium documents the presence of coronary artery disease, the severity of this disease and any potential stenosis cannot be assessed on this non-gated CT examination. Assessment for potential risk factor modification, dietary therapy or pharmacologic therapy may be warranted, if clinically indicated. 5. Additional incidental findings, as above. Electronically Signed   By: Vinnie Langton M.D.   On: 10/04/2018 14:55   Dg Chest Port 1 View  Result Date: 10/31/2018 CLINICAL DATA:  Hypotension EXAM: PORTABLE CHEST 1 VIEW COMPARISON:  None. FINDINGS: Tracheostomy is present with tip 6.9 cm above the carina. Port-A-Cath tip is in  the superior vena cava. No pneumothorax. There is extensive postoperative change on the left with consolidation throughout most of the left upper and mid lung regions. There is aeration in the left base region with fibrotic appearing changes in the left base. On the right, there is fibrosis in the mid and lower lung zones. There is postoperative change in the right upper  lobe. No consolidation on the right. Heart size is grossly normal. Pulmonary vascular on the right appears normal. Pulmonary vascularity on the left is obscured. No adenopathy is seen to the right of midline. Opacification precludes assessment for potential adenopathy on the left. No bone lesions are evident. IMPRESSION: Extensive postoperative changes. Consolidation throughout much of the left mid and lower lung zones. Question infiltrate versus mass. Both entities may be present concurrently. There is fibrosis in the left base. There is also fibrosis in the right mid and lower lung zones. No consolidation on the right. Cardiac silhouette grossly normal. Tube and catheter positions as described without demonstrable pneumothorax. Correlation with CT, ideally with intravenous contrast, may well be advised to further assess areas which are opacified on this current examination. Electronically Signed   By: Lowella Grip III M.D.   On: 10/04/2018 12:00    Microbiology Recent Results (from the past 240 hour(s))  SARS CORONAVIRUS 2 (TAT 6-12 HRS) Nasal Swab Aptima Multi Swab     Status: None   Collection Time: 10/31/2018 11:49 AM   Specimen: Aptima Multi Swab; Nasal Swab  Result Value Ref Range Status   SARS Coronavirus 2 NEGATIVE NEGATIVE Final    Comment: (NOTE) SARS-CoV-2 target nucleic acids are NOT DETECTED. The SARS-CoV-2 RNA is generally detectable in upper and lower respiratory specimens during the acute phase of infection. Negative results do not preclude SARS-CoV-2 infection, do not rule out co-infections with other pathogens,  and should not be used as the sole basis for treatment or other patient management decisions. Negative results must be combined with clinical observations, patient history, and epidemiological information. The expected result is Negative. Fact Sheet for Patients: SugarRoll.be Fact Sheet for Healthcare Providers: https://www.woods-mathews.com/ This test is not yet approved or cleared by the [place] FDA and  has been authorized for detection and/or diagnosis of SARS-CoV-2 by FDA under an Emergency Use Authorization (EUA). This EUA will remain  in effect (meaning this test can be used) for the duration of the COVID-19 declaration under Section 56 4(b)(1) of the Act, 21 U.S.C. section 360bbb-3(b)(1), unless the authorization is terminated or revoked sooner. Performed at [facility], [place] [address] [place] [address] [place], [place] [address]   Culture, blood (Routine X 2) w Reflex to ID Panel     Status: None   Collection Time: 10/03/2018 12:40 PM   Specimen: BLOOD  Result Value Ref Range Status   Specimen Description BLOOD RIGHT ANTECUBITAL  Final   Special Requests   Final    BOTTLES DRAWN AEROBIC AND ANAEROBIC Blood Culture adequate volume   Culture   Final    NO GROWTH 5 DAYS Performed at [facility], [place] [address], [place], [place] [address]    Report Status 11/01/2018 FINAL  Final  Culture, blood (Routine X 2) w Reflex to ID Panel     Status: None   Collection Time: 10/29/2018 12:49 PM   Specimen: BLOOD RIGHT FOREARM  Result Value Ref Range Status   Specimen Description BLOOD RIGHT FOREARM  Final   Special Requests   Final    BOTTLES DRAWN AEROBIC AND ANAEROBIC Blood Culture results may not be optimal due to an inadequate volume of blood received in culture bottles   Culture   Final    NO GROWTH 5 DAYS Performed at [facility], [place] [address], [place], [place] [address]    Report Status 11/01/2018 FINAL  Final  Urine culture      Status:  None   Collection Time: 10/24/2018 12:51 PM   Specimen: Urine, Random  Result Value Ref Range Status   Specimen Description URINE, RANDOM  Final   Special Requests NONE  Final   Culture   Final    NO GROWTH Performed at [facility], [address] [place], [place] [address]    Report Status 11-14-18 FINAL  Final  Culture, respiratory (non-expectorated)     Status: None   Collection Time: 10/23/2018  9:56 PM   Specimen: Tracheal Aspirate; Respiratory  Result Value Ref Range Status   Specimen Description TRACHEAL ASPIRATE  Final   Special Requests NONE  Final   Gram Stain   Final    FEW WBC PRESENT, PREDOMINANTLY PMN MODERATE GRAM NEGATIVE RODS RARE GRAM POSITIVE COCCI Performed at [facility], [place] [address] [place], [place] [address]    Culture   Final    ABUNDANT PSEUDOMONAS AERUGINOSA MODERATE KLEBSIELLA PNEUMONIAE    Report Status 11/01/2018 FINAL  Final   Organism ID, Bacteria PSEUDOMONAS AERUGINOSA  Final   Organism ID, Bacteria KLEBSIELLA PNEUMONIAE  Final      Susceptibility   Klebsiella pneumoniae - MIC*    AMPICILLIN >=32 RESISTANT Resistant     CEFAZOLIN <=4 SENSITIVE Sensitive     CEFEPIME <=1 SENSITIVE Sensitive     CEFTAZIDIME <=1 SENSITIVE Sensitive     CEFTRIAXONE <=1 SENSITIVE Sensitive     CIPROFLOXACIN <=0.25 SENSITIVE Sensitive     GENTAMICIN <=1 SENSITIVE Sensitive     IMIPENEM <=0.25 SENSITIVE Sensitive     TRIMETH/SULFA <=20 SENSITIVE Sensitive     AMPICILLIN/SULBACTAM 4 SENSITIVE Sensitive     PIP/TAZO 8 SENSITIVE Sensitive     Extended ESBL NEGATIVE Sensitive     * MODERATE KLEBSIELLA PNEUMONIAE   Pseudomonas aeruginosa - MIC*    CEFTAZIDIME 2 SENSITIVE Sensitive     CIPROFLOXACIN <=0.25 SENSITIVE Sensitive     GENTAMICIN <=1 SENSITIVE Sensitive     IMIPENEM 2 SENSITIVE Sensitive     PIP/TAZO 8 SENSITIVE Sensitive     CEFEPIME <=1 SENSITIVE Sensitive     * ABUNDANT PSEUDOMONAS AERUGINOSA  MRSA PCR Screening      Status: Abnormal   Collection Time: 10/18/2018 10:13 PM   Specimen: Nasopharyngeal  Result Value Ref Range Status   MRSA by PCR POSITIVE (A) NEGATIVE Final    Comment:        The GeneXpert MRSA Assay (FDA approved for NASAL specimens only), is one component of a comprehensive MRSA colonization surveillance program. It is not intended to diagnose MRSA infection nor to guide or monitor treatment for MRSA infections. RESULT CALLED TO, READ BACK BY AND VERIFIED WITH: Huntley Dec RN 10/26/18 2346 JDW Performed at [facility], [place] [address] [place], [place] [address]     Lab Basic Metabolic Panel: Recent Labs  Lab 10/11/2018 2138 14-Nov-2018 0437  NA  --  142  K  --  4.3  CREATININE 0.79  --    Liver Function Tests: No results for input(s): AST, ALT, ALKPHOS, BILITOT, PROT, ALBUMIN in the last 168 hours. No results for input(s): LIPASE, AMYLASE in the last 168 hours. No results for input(s): AMMONIA in the last 168 hours. CBC: Recent Labs  Lab 10/12/2018 2000 Nov 14, 2018 0358 2018/11/14 0437 Nov 14, 2018 0800  WBC  --   --   --  17.5*  NEUTROABS  --   --   --  16.1*  HGB 8.6* 7.9* 8.8* 7.9*  HCT  28.0* 25.5* 26.0* 25.8*  MCV  --   --   --  97.0  PLT  --   --   --  187   Cardiac Enzymes: No results for input(s): CKTOTAL, CKMB, CKMBINDEX, TROPONINI in the last 168 hours. Sepsis Labs: Recent Labs  Lab 11-03-2018 0800  WBC 17.5*    Procedures/Operations     Jennet Maduro 11/03/2018, 6:23 PM

## 2019-01-18 ENCOUNTER — Telehealth: Payer: Self-pay

## 2019-01-18 NOTE — Telephone Encounter (Signed)
Received dc from [place].  DC is for burial and a patient of Doctor Nelda Marseille.  DC will be taken to [facility] for signature.  On 01/19/2019 Received signed dc back from Doctor Nelda Marseille.  I called the funeral home to let them know Nakia with [facility] is coming by to pickup the dc.

## 2021-08-08 IMAGING — CT CT ABDOMEN AND PELVIS WITH CONTRAST
2 of 5 series · 11 of 46 positions shown, 12 images · IV contrast (Omni 300)
Comparison: No priors.

CLINICAL DATA: 62-year-old male with history of unresolved
pneumonia.

EXAM:
CT CHEST, ABDOMEN, AND PELVIS WITH CONTRAST
TECHNIQUE: Multidetector CT imaging of the chest, abdomen and pelvis was
performed following the standard protocol during bolus
administration of intravenous contrast.
CONTRAST:  100mL OMNIPAQUE IOHEXOL 300 MG/ML  SOLN

[Series 4: cap with 5mm st · axial · 0.78mm/px · z∈[-533,+7]mm · 8 of 132 slices shown, 9 images]
[im 12/132  soft-tissue]
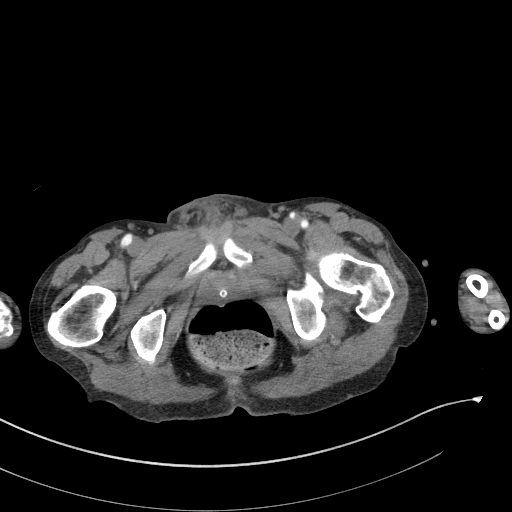
[im 12/132  bone]
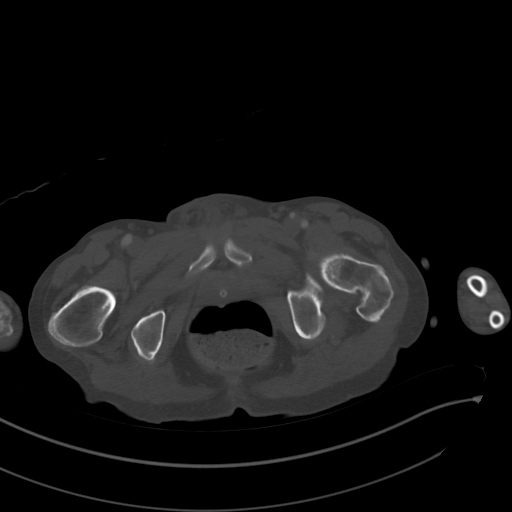
[im 24/132  soft-tissue]
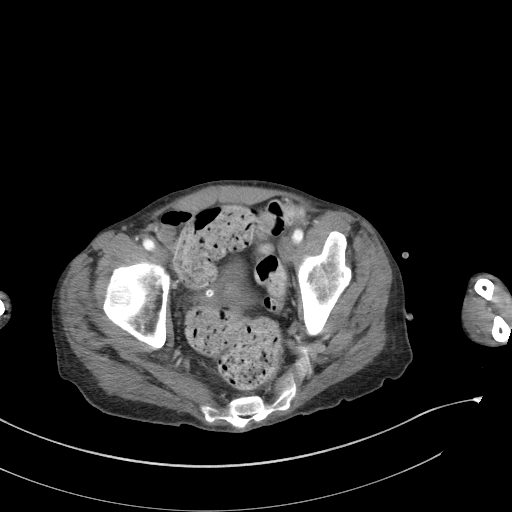
[im 48/132  soft-tissue]
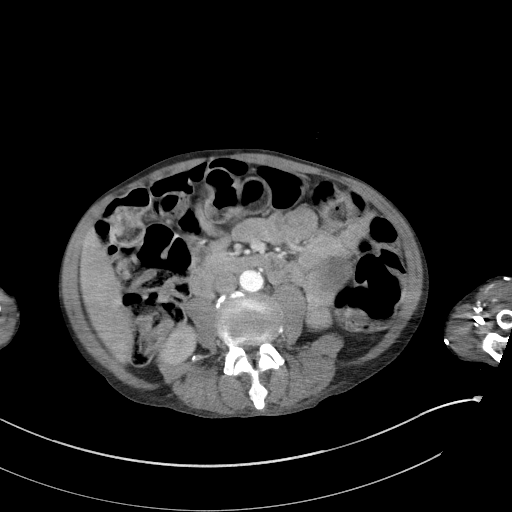
[im 60/132  soft-tissue]
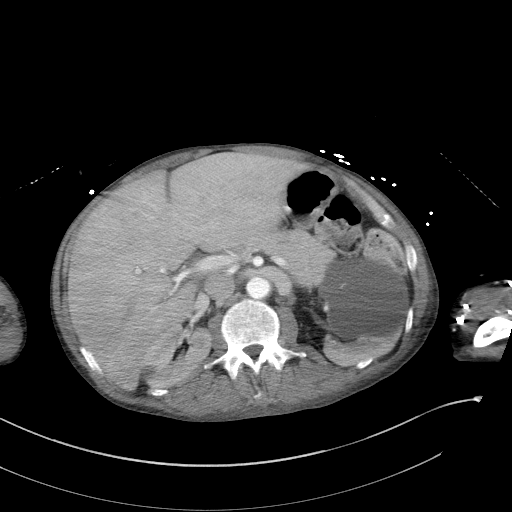
[im 72/132  soft-tissue]
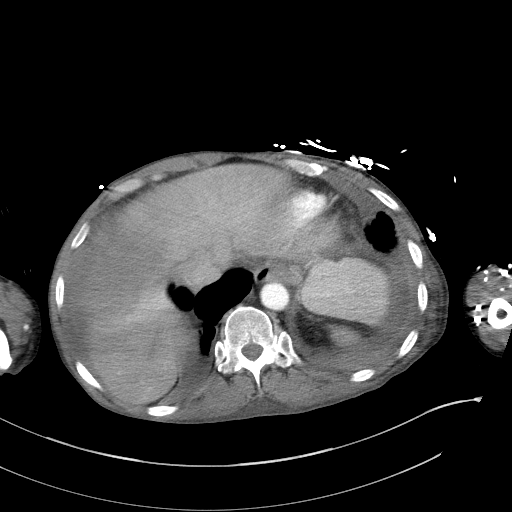
[im 84/132  soft-tissue]
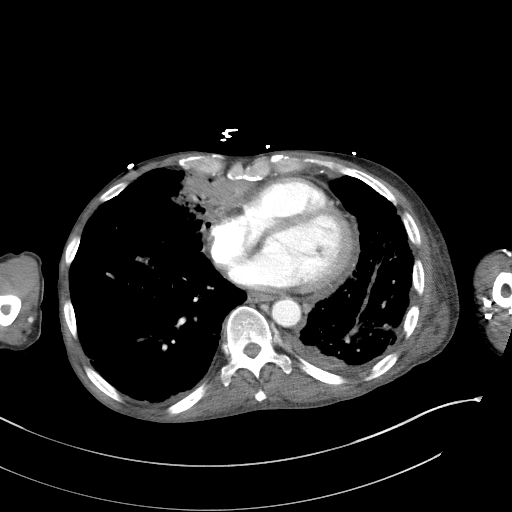
[im 108/132  soft-tissue]
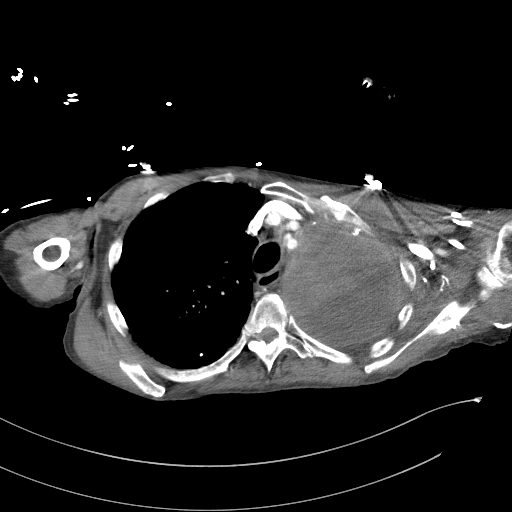
[im 120/132  soft-tissue]
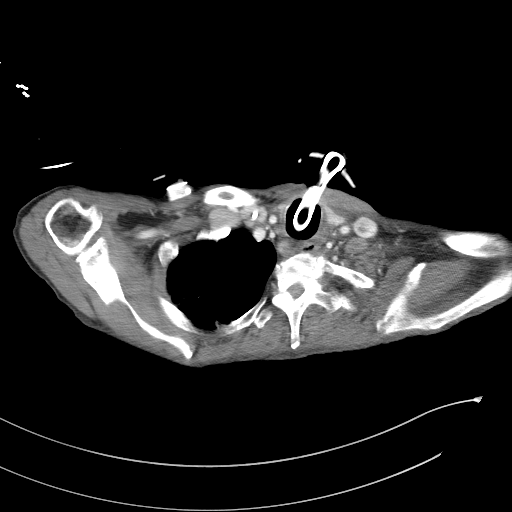

[Series 6: cap with 3mm st cor · coronal · 0.65mm/px · 3 of 120 slices shown]
[im 40/120  soft-tissue]
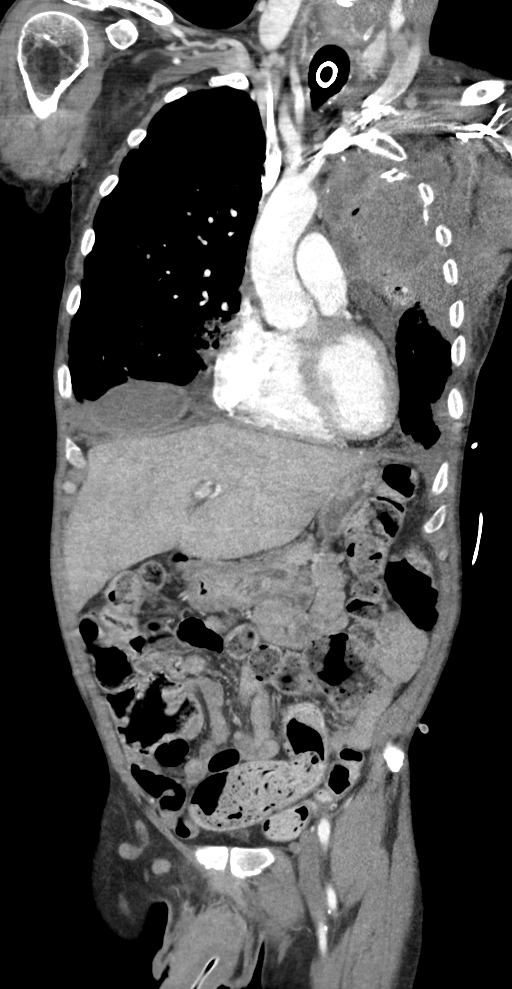
[im 53/120  soft-tissue]
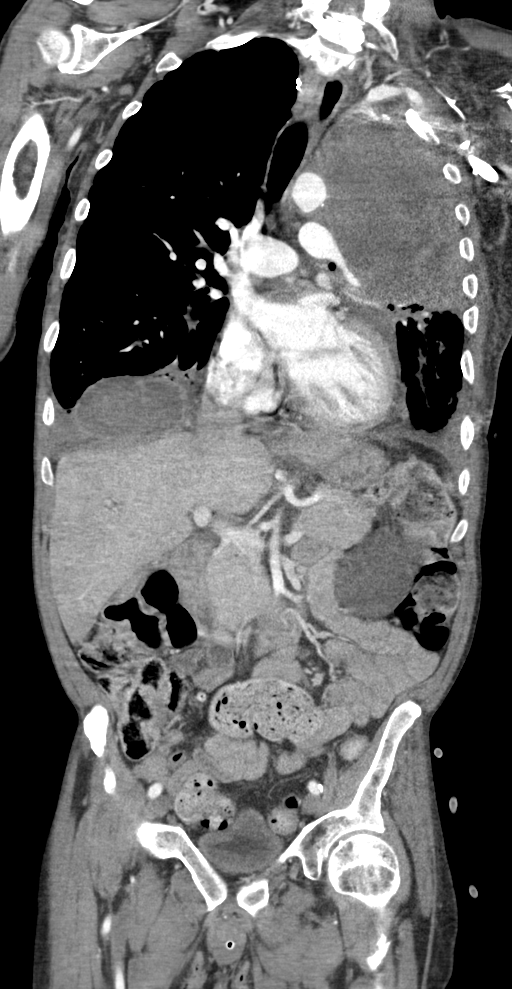
[im 67/120  soft-tissue]
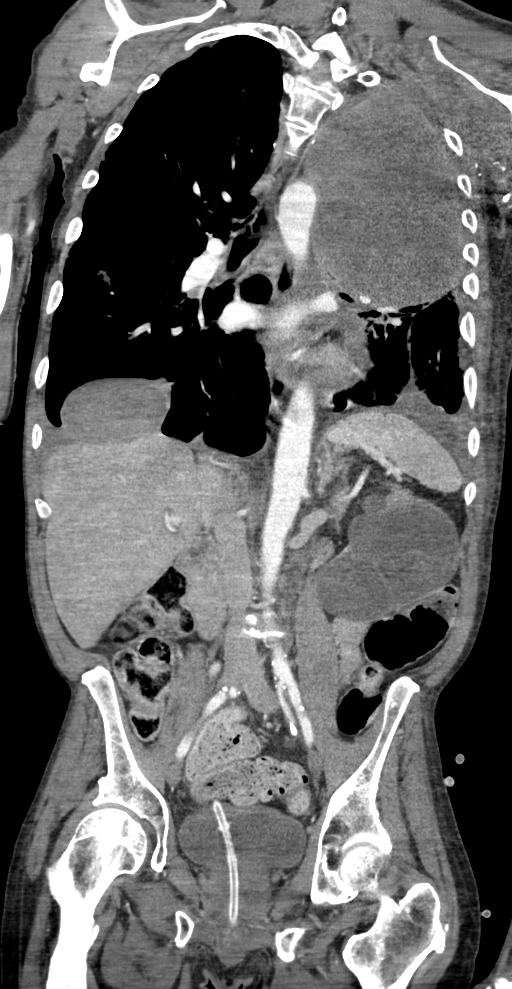

[11 of 46 positions shown; findings below may reference images not displayed]

FINDINGS: CT CHEST FINDINGS

Cardiovascular: Heart size is normal. There is no significant
pericardial fluid, thickening or pericardial calcification. There is
aortic atherosclerosis, as well as atherosclerosis of the great
vessels of the mediastinum and the coronary arteries, including
calcified atherosclerotic plaque in the left anterior descending
coronary artery. Right internal jugular single-lumen porta cath with
tip terminating in the mid superior vena cava.

Mediastinum/Nodes: Tracheostomy tube in position with tip in the mid
trachea. Borderline enlarged subcarinal lymph node measuring 1.1 cm
in short axis. No other pathologically enlarged mediastinal or hilar
lymph nodes. Esophagus is unremarkable in appearance. No axillary
lymphadenopathy.

Lungs/Pleura: Patchy areas of airspace consolidation are noted in
the lungs bilaterally, most confluent in the right middle lobe where
there is also extensive volume loss. Several other nodular appearing
areas are noted, largest of which includes a 1.7 x 0.9 cm area in
the right lower lobe (axial image 97 of series 5). Large complex
loculated left pleural fluid collection in the mid to upper left
hemithorax with peripheral rim enhancement, most compatible with a
large empyema measuring 13.8 x 10.0 x 4.7 cm (axial image 33 of
series 4 and coronal image 63 of series 6). Other smaller complex
loculated rim enhancing fluid collections are noted bilaterally,
largest of which is in the subpulmonic space in the lower right
hemithorax (axial image 58 of series 4 and coronal image 49 of
series 6) measuring 11.3 x 6.6 x 3.9 cm. Near complete atelectasis
of much of the left lung with some aerated portions in the basal
segments of the left lower lobe where there is extensive atelectasis
and patchy airspace consolidation. Diffuse bronchial wall thickening
with severe centrilobular and paraseptal emphysema.

Musculoskeletal: There are no aggressive appearing lytic or blastic
lesions noted in the visualized portions of the skeleton.

CT ABDOMEN PELVIS FINDINGS

Hepatobiliary: No suspicious cystic or solid hepatic lesions. No
intra or extrahepatic biliary ductal dilatation. Gallbladder is
nearly decompressed, but otherwise unremarkable in appearance.

Pancreas: No pancreatic mass. No pancreatic ductal dilatation. No
pancreatic or peripancreatic fluid collections or inflammatory
changes.

Spleen: Unremarkable.

Adrenals/Urinary Tract: Bilateral adrenal glands are normal in
appearance. Low-attenuation lesions in both kidneys, compatible with
simple cysts, largest of which measure up to 3.6 x 2.7 cm in the
upper pole of left kidney. In addition, in the interpolar region of
the left kidney there is a 8.7 x 7.5 cm low-attenuation lesion with
a thin internal septation, compatible with a large Bosniak class 2
cyst. No suspicious renal lesions. Subcentimeter low-attenuation
lesions in the right kidney, too small to definitively characterize,
but statistically likely to represent tiny cysts. No
hydroureteronephrosis. Foley balloon catheter in the urinary
bladder. Gas in the nondependent portion of the urinary bladder,
presumably iatrogenic. Urinary bladder is otherwise unremarkable in
appearance.

Stomach/Bowel: Percutaneous gastrostomy tube in the distal body of
the stomach. No pathologic dilatation of small bowel or colon.
Normal appendix.

Vascular/Lymphatic: Aortic atherosclerosis, without evidence of
aneurysm or dissection in the abdominal or pelvic vasculature. No
lymphadenopathy noted in the abdomen or pelvis.

Reproductive: Prostate gland and seminal vesicles are unremarkable
in appearance.

Other: No significant volume of ascites.  No pneumoperitoneum.

Musculoskeletal: There are no aggressive appearing lytic or blastic
lesions noted in the visualized portions of the skeleton.
IMPRESSION: 1. Multilobar bilateral pneumonia with bilateral empyemas, as
detailed above.
2. No acute findings noted in the abdomen or pelvis.
3. Diffuse bronchial wall thickening with severe centrilobular and
paraseptal emphysema; imaging findings suggestive of underlying
COPD.
4. Aortic atherosclerosis, in addition to left anterior descending
coronary artery disease. Please note that although the presence of
coronary artery calcium documents the presence of coronary artery
disease, the severity of this disease and any potential stenosis
cannot be assessed on this non-gated CT examination. Assessment for
potential risk factor modification, dietary therapy or pharmacologic
therapy may be warranted, if clinically indicated.
5. Additional incidental findings, as above.

## 2021-11-01 DEATH — deceased
